# Patient Record
Sex: Female | Born: 1975
Health system: Southern US, Community
[De-identification: ages and names within clinical notes are randomized; demographics above are authoritative.]

## PROBLEM LIST (undated history)

## (undated) HISTORY — PX: OTHER SURGICAL HISTORY: SHX169

## (undated) HISTORY — PX: WISDOM TOOTH EXTRACTION: SHX21

---

## 2004-10-27 HISTORY — PX: TUBAL LIGATION: SHX77

## 2015-02-09 ENCOUNTER — Telehealth: Payer: Self-pay | Admitting: *Deleted

## 2015-02-09 ENCOUNTER — Encounter: Payer: Self-pay | Admitting: *Deleted

## 2015-02-09 NOTE — Telephone Encounter (Signed)
Pre-Visit Call completed with patient and chart updated.   Pre-Visit Info documented in Specialty Comments under [employer].    

## 2015-02-12 ENCOUNTER — Ambulatory Visit (INDEPENDENT_AMBULATORY_CARE_PROVIDER_SITE_OTHER): Payer: 59 | Admitting: Family

## 2015-02-12 ENCOUNTER — Encounter: Payer: Self-pay | Admitting: Family

## 2015-02-12 VITALS — BP 110/70 | HR 95 | Temp 98.1°F | Resp 18 | Ht 60.0 in | Wt 133.2 lb

## 2015-02-12 DIAGNOSIS — L309 Dermatitis, unspecified: Secondary | ICD-10-CM

## 2015-02-12 DIAGNOSIS — Z23 Encounter for immunization: Secondary | ICD-10-CM

## 2015-02-12 DIAGNOSIS — Z0000 Encounter for general adult medical examination without abnormal findings: Secondary | ICD-10-CM | POA: Diagnosis not present

## 2015-02-12 LAB — URINALYSIS, ROUTINE W REFLEX MICROSCOPIC
BILIRUBIN URINE: NEGATIVE
KETONES UR: NEGATIVE
Leukocytes, UA: NEGATIVE
Nitrite: NEGATIVE
Specific Gravity, Urine: <=1.005 — AB (ref 1.000–1.030)
TOTAL PROTEIN, URINE-UPE24: NEGATIVE
URINE GLUCOSE: NEGATIVE
Urobilinogen, UA: 0.2 (ref 0.0–1.0)
WBC, UA: NONE SEEN (ref 0–?)
pH: 6 (ref 5.0–8.0)

## 2015-02-12 LAB — BASIC METABOLIC PANEL
BUN: 11 mg/dL (ref 6–23)
CHLORIDE: 103 meq/L (ref 96–112)
CO2: 26 mEq/L (ref 19–32)
Calcium: 9.9 mg/dL (ref 8.4–10.5)
Creatinine, Ser: 0.61 mg/dL (ref 0.40–1.20)
GFR: 116.58 mL/min (ref >60.00)
Glucose, Bld: 90 mg/dL (ref 70–99)
Potassium: 3.7 mEq/L (ref 3.5–5.1)
Sodium: 137 mEq/L (ref 135–145)

## 2015-02-12 LAB — LIPID PANEL
Cholesterol: 178 mg/dL (ref 0–200)
HDL: 47.2 mg/dL (ref >39.00)
LDL CALC: 96 mg/dL (ref 0–99)
NonHDL: 130.8
Total CHOL/HDL Ratio: 4
Triglycerides: 175 mg/dL — ABNORMAL HIGH (ref 0.0–149.0)
VLDL: 35 mg/dL (ref 0.0–40.0)

## 2015-02-12 LAB — CBC WITH DIFFERENTIAL/PLATELET
Basophils Absolute: 0 10*3/uL (ref 0.0–0.1)
Basophils Relative: 0.5 % (ref 0.0–3.0)
EOS ABS: 0.2 10*3/uL (ref 0.0–0.7)
EOS PCT: 2.2 % (ref 0.0–5.0)
HCT: 30.3 % — ABNORMAL LOW (ref 36.0–46.0)
Hemoglobin: 9.7 g/dL — ABNORMAL LOW (ref 12.0–15.0)
LYMPHS PCT: 26.1 % (ref 12.0–46.0)
Lymphs Abs: 2.2 10*3/uL (ref 0.7–4.0)
MCHC: 32 g/dL (ref 30.0–36.0)
MCV: 67.1 fl — ABNORMAL LOW (ref 78.0–100.0)
Monocytes Absolute: 0.6 10*3/uL (ref 0.1–1.0)
Monocytes Relative: 6.4 % (ref 3.0–12.0)
NEUTROS ABS: 5.6 10*3/uL (ref 1.4–7.7)
Neutrophils Relative %: 64.8 % (ref 43.0–77.0)
Platelets: 373 10*3/uL (ref 150.0–400.0)
RBC: 4.51 Mil/uL (ref 3.87–5.11)
RDW: 18.2 % — ABNORMAL HIGH (ref 11.5–15.5)
WBC: 8.6 10*3/uL (ref 4.0–10.5)

## 2015-02-12 LAB — HEPATIC FUNCTION PANEL
ALBUMIN: 4.2 g/dL (ref 3.5–5.2)
ALK PHOS: 90 U/L (ref 39–117)
ALT: 16 U/L (ref 0–35)
AST: 18 U/L (ref 0–37)
Bilirubin, Direct: 0.1 mg/dL (ref 0.0–0.3)
TOTAL PROTEIN: 7.9 g/dL (ref 6.0–8.3)
Total Bilirubin: 0.4 mg/dL (ref 0.2–1.2)

## 2015-02-12 LAB — TSH: TSH: 4.71 u[IU]/mL — AB (ref 0.35–4.50)

## 2015-02-12 MED ORDER — CLOTRIMAZOLE-BETAMETHASONE 1-0.05 % EX CREA: 1.0000 "application " | TOPICAL_CREAM | Freq: Two times a day (BID) | CUTANEOUS | Status: DC

## 2015-02-12 NOTE — Progress Notes (Signed)
Subjective:    Patient ID: Brittany Banks, female    DOB: [birth year], 39 y.o.   MRN: [id number]  HPI   Brittany Banks is a 39 yr old female who presents today to establish care.   Immunizations: >10 years Diet: reports vegetarian diet Exercise: no  Pap Smear:  ? 9 years ago.   Reports several month hx of dorsal foot rash, + pruritis.    Review of Systems  Constitutional: Negative for unexpected weight change.  HENT: Negative for hearing loss and rhinorrhea.   Eyes: Negative for visual disturbance.  Respiratory: Negative for cough.   Cardiovascular: Negative for leg swelling.  Gastrointestinal: Negative for nausea, diarrhea and constipation.  Genitourinary: Negative for dysuria and frequency.       Notes some irregular menses  Musculoskeletal: Negative for myalgias and arthralgias.       Occasional low back pain  Skin: Positive for rash.       Bilateral dorsal foot.   Neurological:       Occasional headaches around menses  Hematological: Negative for adenopathy.  Psychiatric/Behavioral: Negative for dysphoric mood and agitation.   History reviewed. No pertinent past medical history.  History   Social History  . Marital Status: Married    Spouse Name: N/A  . Number of Children: N/A  . Years of Education: N/A   Occupational History  . Not on file.   Social History Main Topics  . Smoking status: Never Smoker   . Smokeless tobacco: Never Used  . Alcohol Use: No  . Drug Use: No  . Sexual Activity: Not on file   Other Topics Concern  . Not on file   Social History Narrative   Works at [employer] in [place]- moved here 2010   Completed bachelors in [place]   2 children   Married   Enjoys spending time with her children.      Past Surgical History  Procedure Laterality Date  . Wisdom tooth extraction    . Tubal ligation  2006    Family History  Problem Relation Age of Onset  . Kidney disease Son 2    kidney cancer 14 yrs ago  . Hypertension  Mother     No Known Allergies  No current outpatient prescriptions on file prior to visit.   No current facility-administered medications on file prior to visit.    BP 110/70 mmHg  Pulse 95  Temp(Src) 98.1 F (36.7 C) (Oral)  Resp 18  Ht 5' (1.524 m)  Wt 133 lb 3.2 oz (60.419 kg)  BMI 26.01 kg/m2  SpO2 99%       Objective:   Physical Exam  Physical Exam  Constitutional: She is oriented to person, place, and time. She appears well-developed and well-nourished. No distress.  HENT:  Head: small <pea sized, non-tender, non-mobile bony nodule noted left temporal skull.  Right Ear: Tympanic membrane and ear canal normal.  Left Ear: Tympanic membrane and ear canal normal.  Mouth/Throat: Oropharynx is clear and moist.  Eyes: Pupils are equal, round, and reactive to light. No scleral icterus.  Neck: Normal range of motion. No thyromegaly present.  Cardiovascular: Normal rate and regular rhythm.   No murmur heard. Pulmonary/Chest: Effort normal and breath sounds normal. No respiratory distress. He has no wheezes. She has no rales. She exhibits no tenderness.  Abdominal: Soft. Bowel sounds are normal. He exhibits no distension and no mass. There is no tenderness. There is no rebound and no guarding.  Musculoskeletal: She exhibits no edema.  Lymphadenopathy:    She has no cervical adenopathy.  Neurological: She is alert and oriented to person, place, and time. She has normal reflexes. She exhibits normal muscle tone. Coordination normal.  Skin: Skin is warm and dry. hyperpigmented thickened skin, bilateral dorsal feet R>L Psychiatric: She has a normal mood and affect. Her behavior is normal. Judgment and thought content normal.  Breasts: Examined lying Right: Without masses, retractions, discharge or axillary adenopathy.  Left: Without masses, retractions, discharge or axillary adenopathy.  Inguinal/mons: Normal without inguinal adenopathy  External genitalia: Normal    BUS/Urethra/Skene's glands: Normal  Bladder: Normal  Vagina: Normal  Cervix: difficulty to visualize due to pt discomfort Uterus: normal in size, shape and contour. Midline and mobile  Adnexa/parametria:  Rt: Without masses or tenderness.  Lt: Without masses or tenderness.  Anus and perineum: Normal           Assessment & Plan:        Assessment & Plan:  Advised pt to let me know if bony prominence left temporal area enlarges.

## 2015-02-12 NOTE — Progress Notes (Signed)
Pre visit review using our clinic review tool, if applicable. No additional management support is needed unless otherwise documented below in the visit note. 

## 2015-02-12 NOTE — Addendum Note (Signed)
Addended by: Kelle Darting A on: 02/12/2015 12:06 PM   Modules accepted: Orders

## 2015-02-12 NOTE — Assessment & Plan Note (Addendum)
Immunizations reviewed and up to date.  Continue healthy diet, exercise.  Obtain routine labs. Attempted pap. She was unable to tolerated speculum exam. Will refer to GYN.

## 2015-02-12 NOTE — Patient Instructions (Addendum)
Please complete lab work prior to leaving. Work on adding exercise- goal is 30 minutes 5 days a week. Follow up in 1 year, sooner if problems/concerns.  Welcome to [facility]!

## 2015-02-12 NOTE — Assessment & Plan Note (Signed)
Trial of lotrisone

## 2015-02-13 ENCOUNTER — Other Ambulatory Visit (INDEPENDENT_AMBULATORY_CARE_PROVIDER_SITE_OTHER): Payer: 59

## 2015-02-13 DIAGNOSIS — D649 Anemia, unspecified: Secondary | ICD-10-CM | POA: Diagnosis not present

## 2015-02-13 DIAGNOSIS — E039 Hypothyroidism, unspecified: Secondary | ICD-10-CM | POA: Diagnosis not present

## 2015-02-13 DIAGNOSIS — I519 Heart disease, unspecified: Secondary | ICD-10-CM

## 2015-02-13 LAB — T3, FREE: T3 FREE: 3.7 pg/mL (ref 2.3–4.2)

## 2015-02-13 LAB — IRON: Iron: 14 ug/dL — ABNORMAL LOW (ref 42–145)

## 2015-02-13 LAB — T4, FREE: Free T4: 0.98 ng/dL (ref 0.60–1.60)

## 2015-02-14 ENCOUNTER — Telehealth: Payer: Self-pay | Admitting: Family

## 2015-02-14 DIAGNOSIS — E038 Other specified hypothyroidism: Secondary | ICD-10-CM | POA: Insufficient documentation

## 2015-02-14 DIAGNOSIS — E039 Hypothyroidism, unspecified: Secondary | ICD-10-CM

## 2015-02-14 DIAGNOSIS — D509 Iron deficiency anemia, unspecified: Secondary | ICD-10-CM

## 2015-02-14 NOTE — Telephone Encounter (Signed)
Please let pt know she is very anemic.  I would like her to complete IFOB (dx anemia), start iron 325mg  bid, repeat serum iron and cbc in 3 months.  Also, thyroid borderline low. I would like her to repeat tsh, free T3 free t4 in 3 months as well, dx subclinical hypothyroid.

## 2015-02-14 NOTE — Telephone Encounter (Signed)
Notified pt and she voices understanding. Lab appt scheduled for 05/15/15 at 1:30pm. Future lab orders entered.

## 2015-05-15 ENCOUNTER — Other Ambulatory Visit (INDEPENDENT_AMBULATORY_CARE_PROVIDER_SITE_OTHER): Payer: 59

## 2015-05-15 DIAGNOSIS — E039 Hypothyroidism, unspecified: Secondary | ICD-10-CM

## 2015-05-15 DIAGNOSIS — E038 Other specified hypothyroidism: Secondary | ICD-10-CM | POA: Diagnosis not present

## 2015-05-15 DIAGNOSIS — D509 Iron deficiency anemia, unspecified: Secondary | ICD-10-CM | POA: Diagnosis not present

## 2015-05-15 LAB — CBC WITH DIFFERENTIAL/PLATELET
BASOS PCT: 0.5 % (ref 0.0–3.0)
Basophils Absolute: 0.1 10*3/uL (ref 0.0–0.1)
Eosinophils Absolute: 0.3 10*3/uL (ref 0.0–0.7)
Eosinophils Relative: 3 % (ref 0.0–5.0)
HEMATOCRIT: 29.6 % — AB (ref 36.0–46.0)
Hemoglobin: 9.3 g/dL — ABNORMAL LOW (ref 12.0–15.0)
Lymphocytes Relative: 30.1 % (ref 12.0–46.0)
Lymphs Abs: 2.9 10*3/uL (ref 0.7–4.0)
MCHC: 31.4 g/dL (ref 30.0–36.0)
Monocytes Absolute: 0.6 10*3/uL (ref 0.1–1.0)
Monocytes Relative: 6.5 % (ref 3.0–12.0)
Neutro Abs: 5.8 10*3/uL (ref 1.4–7.7)
Neutrophils Relative %: 59.9 % (ref 43.0–77.0)
Platelets: 314 10*3/uL (ref 150.0–400.0)
RBC: 4.45 Mil/uL (ref 3.87–5.11)
RDW: 18.9 % — ABNORMAL HIGH (ref 11.5–15.5)
WBC: 9.6 10*3/uL (ref 4.0–10.5)

## 2015-05-15 LAB — TSH: TSH: 4.5 u[IU]/mL (ref 0.35–4.50)

## 2015-05-15 LAB — IRON: Iron: 17 ug/dL — ABNORMAL LOW (ref 42–145)

## 2015-05-15 LAB — T3, FREE: T3 FREE: 3.9 pg/mL (ref 2.3–4.2)

## 2015-05-15 LAB — T4, FREE: FREE T4: 0.77 ng/dL (ref 0.60–1.60)

## 2015-05-18 ENCOUNTER — Encounter: Payer: Self-pay | Admitting: Family

## 2015-05-18 ENCOUNTER — Telehealth: Payer: Self-pay | Admitting: *Deleted

## 2015-05-18 ENCOUNTER — Ambulatory Visit (INDEPENDENT_AMBULATORY_CARE_PROVIDER_SITE_OTHER): Payer: 59 | Admitting: Family

## 2015-05-18 VITALS — BP 100/62 | HR 89 | Temp 98.9°F | Resp 16 | Ht 60.0 in | Wt 139.4 lb

## 2015-05-18 DIAGNOSIS — L309 Dermatitis, unspecified: Secondary | ICD-10-CM | POA: Diagnosis not present

## 2015-05-18 DIAGNOSIS — R21 Rash and other nonspecific skin eruption: Secondary | ICD-10-CM

## 2015-05-18 DIAGNOSIS — E038 Other specified hypothyroidism: Secondary | ICD-10-CM | POA: Diagnosis not present

## 2015-05-18 DIAGNOSIS — D649 Anemia, unspecified: Secondary | ICD-10-CM

## 2015-05-18 DIAGNOSIS — E039 Hypothyroidism, unspecified: Secondary | ICD-10-CM

## 2015-05-18 DIAGNOSIS — D509 Iron deficiency anemia, unspecified: Secondary | ICD-10-CM | POA: Diagnosis not present

## 2015-05-18 MED ORDER — BETAMETHASONE DIPROPIONATE 0.05 % EX CREA: TOPICAL_CREAM | Freq: Two times a day (BID) | CUTANEOUS | Status: DC

## 2015-05-18 NOTE — Assessment & Plan Note (Addendum)
Advised pt to start iron supplement.  I gave her number for GYN to call to address heavy periods and for her PAP smear.

## 2015-05-18 NOTE — Assessment & Plan Note (Signed)
I don't think rash is fungal. Will change lotrisone to betamethasone and refer to dermatology.

## 2015-05-18 NOTE — Patient Instructions (Signed)
You will be contacted about your referral to dermatology.  Start iron supplement, complete stool kit and return by mail.  Follow up in 3 months.

## 2015-05-18 NOTE — Progress Notes (Signed)
Pre visit review using our clinic review tool, if applicable. No additional management support is needed unless otherwise documented below in the visit note. 

## 2015-05-18 NOTE — Progress Notes (Signed)
   Subjective:    Patient ID: Brittany Banks, female    DOB: 1975/12/03, 39 y.o.   MRN: [id number]  HPI  Brittany Banks is a 39 yr old female who presents today with c/o rash on right foot and knee. Reports that lotrisone has helped but she has now developed patches on her left elbow and scalp.   Anemia-  Reports heavy bleeding x 3 days with clots (changes pad every 1-2 hours).  Reports that she never started iron supplement.  Lab Results  Component Value Date   WBC 9.6 05/15/2015   HGB 9.3* 05/15/2015   HCT 29.6* 05/15/2015   MCV 66.5 Repeated and verified X2.* 05/15/2015   PLT 314.0 05/15/2015      Review of Systems      see HPI  No past medical history on file.  History   Social History  . Marital Status: Married    Spouse Name: N/A  . Number of Children: N/A  . Years of Education: N/A   Occupational History  . Not on file.   Social History Main Topics  . Smoking status: Never Smoker   . Smokeless tobacco: Never Used  . Alcohol Use: No  . Drug Use: No  . Sexual Activity: Not on file   Other Topics Concern  . Not on file   Social History Narrative   Works at [employer] in [place]- moved here 2010   Completed bachelors in [place]   2 children   Married   Enjoys spending time with her children.      Past Surgical History  Procedure Laterality Date  . Wisdom tooth extraction    . Tubal ligation  2006    Family History  Problem Relation Age of Onset  . Kidney disease Son 2    kidney cancer 14 yrs ago  . Hypertension Mother     No Known Allergies  Current Outpatient Prescriptions on File Prior to Visit  Medication Sig Dispense Refill  . clotrimazole-betamethasone (LOTRISONE) cream Apply 1 application topically 2 (two) times daily. 30 g 0  . ferrous sulfate 325 (65 FE) MG tablet Take 325 mg by mouth 2 (two) times daily with a meal.     No current facility-administered medications on file prior to visit.    BP 100/62 mmHg  Pulse 89   Temp(Src) 98.9 F (37.2 C) (Oral)  Resp 16  Ht 5' (1.524 m)  Wt 139 lb 6.4 oz (63.231 kg)  BMI 27.22 kg/m2  SpO2 100%  LMP 03/18/2015    Objective:   Physical Exam  Constitutional: She is oriented to person, place, and time. She appears well-developed and well-nourished. No distress.  HENT:  Head: Normocephalic.  Cardiovascular: Normal rate.   No murmur heard. Pulmonary/Chest: Effort normal and breath sounds normal. No respiratory distress. She has no wheezes. She has no rales. She exhibits no tenderness.  Neurological: She is alert and oriented to person, place, and time.  Psychiatric: She has a normal mood and affect. Her behavior is normal. Judgment and thought content normal.  skin: Scaling rash right dorsal foot- somewhat improved.  Small amount of scaling right knee and bilateral elbows.         Assessment & Plan:

## 2015-05-18 NOTE — Telephone Encounter (Signed)
-----   Message from Debbrah Alar, NP sent at 05/17/2015 11:21 AM EDT ----- Thyroid test looks good.  Iron and blood count is still low.  I do not see that she completed IFOB.  I would still like her to complete. Is she taking iron bid?  If not, needs to start.  If she is taking bid then I need to send her to hematology.

## 2015-05-18 NOTE — Assessment & Plan Note (Signed)
Lab Results  Component Value Date   TSH 4.50 05/15/2015   Monitor TFT's plan follow up in 3 months.

## 2015-05-18 NOTE — Telephone Encounter (Signed)
Notified pt. She has not been taking iron but states she will start taking it. She has f/u in our office today at 1:15pm.  Will give IFOB to pt at that time.  Future order entered.

## 2015-05-25 ENCOUNTER — Other Ambulatory Visit (INDEPENDENT_AMBULATORY_CARE_PROVIDER_SITE_OTHER): Payer: 59

## 2015-05-25 DIAGNOSIS — D649 Anemia, unspecified: Secondary | ICD-10-CM

## 2015-05-25 LAB — FECAL OCCULT BLOOD, IMMUNOCHEMICAL: Fecal Occult Bld: NEGATIVE

## 2015-05-26 ENCOUNTER — Encounter: Payer: Self-pay | Admitting: Family

## 2015-05-31 ENCOUNTER — Telehealth: Payer: Self-pay | Admitting: Family

## 2015-05-31 DIAGNOSIS — D509 Iron deficiency anemia, unspecified: Secondary | ICD-10-CM

## 2015-05-31 NOTE — Telephone Encounter (Signed)
Pt calling for lab results from 05/25/15. Best # [phone].

## 2015-06-01 NOTE — Telephone Encounter (Signed)
Notified pt. She states she has been taking iron twice a day for the last 2 weeks. Has had heavy menstrual cycle and states she feels very weak. Pt wants to know what else she should do? Should she proceed with hematology referral discussed after last labs?  Please advise.

## 2015-06-01 NOTE — Telephone Encounter (Signed)
Notified pt and she voices understanding. States she has not seen GYN yet and is agreeable to referral. She will return to the lab on Monday. Future lab order entered.

## 2015-06-01 NOTE — Telephone Encounter (Signed)
Lets have her return to the lab for follow up cbc (dx anemia).  I think she needs to see GYN- did she schedule appointment?  If anemia is severe or if not improved after 3 months on oral iron then we will proceed with referral to Heme.

## 2015-06-01 NOTE — Telephone Encounter (Signed)
See 05/26/15 letter; no blood in stool.

## 2015-06-04 ENCOUNTER — Other Ambulatory Visit: Payer: 59

## 2015-06-04 ENCOUNTER — Telehealth: Payer: Self-pay | Admitting: Family

## 2015-06-04 DIAGNOSIS — D509 Iron deficiency anemia, unspecified: Secondary | ICD-10-CM

## 2015-06-04 LAB — CBC WITH DIFFERENTIAL/PLATELET
Basophils Absolute: 0 10*3/uL (ref 0.0–0.1)
Basophils Relative: 0.6 % (ref 0.0–3.0)
Eosinophils Absolute: 0.3 10*3/uL (ref 0.0–0.7)
Eosinophils Relative: 3.3 % (ref 0.0–5.0)
HCT: 30.9 % — ABNORMAL LOW (ref 36.0–46.0)
Hemoglobin: 9.8 g/dL — ABNORMAL LOW (ref 12.0–15.0)
LYMPHS PCT: 31.6 % (ref 12.0–46.0)
Lymphs Abs: 2.8 10*3/uL (ref 0.7–4.0)
MCHC: 31.7 g/dL (ref 30.0–36.0)
MCV: 70 fl — AB (ref 78.0–100.0)
MONO ABS: 0.6 10*3/uL (ref 0.1–1.0)
Monocytes Relative: 7 % (ref 3.0–12.0)
NEUTROS ABS: 5.1 10*3/uL (ref 1.4–7.7)
Neutrophils Relative %: 57.5 % (ref 43.0–77.0)
PLATELETS: 388 10*3/uL (ref 150.0–400.0)
RBC: 4.41 Mil/uL (ref 3.87–5.11)
RDW: 23.4 % — ABNORMAL HIGH (ref 11.5–15.5)
WBC: 8.8 10*3/uL (ref 4.0–10.5)

## 2015-06-04 NOTE — Addendum Note (Signed)
Addended by: Debbrah Alar on: 06/04/2015 11:47 AM   Modules accepted: Orders

## 2015-06-04 NOTE — Telephone Encounter (Signed)
Blood count slightly improved. Please follow through with GYN referral (made referral) and continue iron supplement. Repeat cbc in 3 months + serum iron dx iron def anemia.

## 2015-06-04 NOTE — Addendum Note (Signed)
Addended by: Modena Morrow D on: 06/04/2015 10:24 AM   Modules accepted: Orders

## 2015-06-04 NOTE — Telephone Encounter (Signed)
Pt would like [place] to refer her to someone, Please advise your preference?

## 2015-06-06 NOTE — Telephone Encounter (Signed)
Left detailed message on pt's voicemail and to call and schedule lab appt in 3 months. Future lab orders entered.

## 2018-01-06 ENCOUNTER — Encounter: Payer: Self-pay | Admitting: Family

## 2018-01-06 ENCOUNTER — Other Ambulatory Visit: Payer: BLUE CROSS/BLUE SHIELD

## 2018-01-06 ENCOUNTER — Ambulatory Visit (INDEPENDENT_AMBULATORY_CARE_PROVIDER_SITE_OTHER): Payer: BLUE CROSS/BLUE SHIELD | Admitting: Family

## 2018-01-06 ENCOUNTER — Telehealth: Payer: Self-pay | Admitting: *Deleted

## 2018-01-06 VITALS — BP 112/66 | HR 95 | Temp 98.5°F | Resp 16 | Ht 60.5 in | Wt 120.0 lb

## 2018-01-06 DIAGNOSIS — E611 Iron deficiency: Secondary | ICD-10-CM

## 2018-01-06 DIAGNOSIS — D729 Disorder of white blood cells, unspecified: Secondary | ICD-10-CM

## 2018-01-06 DIAGNOSIS — Z0000 Encounter for general adult medical examination without abnormal findings: Secondary | ICD-10-CM | POA: Diagnosis not present

## 2018-01-06 DIAGNOSIS — J309 Allergic rhinitis, unspecified: Secondary | ICD-10-CM | POA: Diagnosis not present

## 2018-01-06 DIAGNOSIS — N920 Excessive and frequent menstruation with regular cycle: Secondary | ICD-10-CM

## 2018-01-06 DIAGNOSIS — K5900 Constipation, unspecified: Secondary | ICD-10-CM | POA: Diagnosis not present

## 2018-01-06 DIAGNOSIS — Z23 Encounter for immunization: Secondary | ICD-10-CM

## 2018-01-06 DIAGNOSIS — D509 Iron deficiency anemia, unspecified: Secondary | ICD-10-CM

## 2018-01-06 LAB — LIPID PANEL
CHOLESTEROL: 158 mg/dL (ref 0–200)
HDL: 50.4 mg/dL (ref >39.00)
LDL Cholesterol: 94 mg/dL (ref 0–99)
NonHDL: 107.99
TRIGLYCERIDES: 69 mg/dL (ref 0.0–149.0)
Total CHOL/HDL Ratio: 3
VLDL: 13.8 mg/dL (ref 0.0–40.0)

## 2018-01-06 LAB — CBC WITH DIFFERENTIAL/PLATELET
BASOS PCT: 2.3 % (ref 0.0–3.0)
Basophils Absolute: 0.1 10*3/uL (ref 0.0–0.1)
Eosinophils Absolute: 0.8 10*3/uL — ABNORMAL HIGH (ref 0.0–0.7)
Eosinophils Relative: 13 % — ABNORMAL HIGH (ref 0.0–5.0)
HCT: 25.9 % — ABNORMAL LOW (ref 36.0–46.0)
Hemoglobin: 7.9 g/dL — CL (ref 12.0–15.0)
LYMPHS ABS: 1.3 10*3/uL (ref 0.7–4.0)
LYMPHS PCT: 20 % (ref 12.0–46.0)
MCHC: 30.6 g/dL (ref 30.0–36.0)
MCV: 59.6 fl — AB (ref 78.0–100.0)
MONOS PCT: 7.6 % (ref 3.0–12.0)
Monocytes Absolute: 0.5 10*3/uL (ref 0.1–1.0)
NEUTROS ABS: 3.7 10*3/uL (ref 1.4–7.7)
Neutrophils Relative %: 57.1 % (ref 43.0–77.0)
PLATELETS: 354 10*3/uL (ref 150.0–400.0)
RBC: 4.35 Mil/uL (ref 3.87–5.11)
RDW: 19.1 % — AB (ref 11.5–15.5)
WBC: 6.4 10*3/uL (ref 4.0–10.5)

## 2018-01-06 LAB — HEPATIC FUNCTION PANEL
ALBUMIN: 3.9 g/dL (ref 3.5–5.2)
ALT: 11 U/L (ref 0–35)
AST: 14 U/L (ref 0–37)
Alkaline Phosphatase: 78 U/L (ref 39–117)
Bilirubin, Direct: 0.1 mg/dL (ref 0.0–0.3)
Total Bilirubin: 0.4 mg/dL (ref 0.2–1.2)
Total Protein: 7.5 g/dL (ref 6.0–8.3)

## 2018-01-06 LAB — URINALYSIS, ROUTINE W REFLEX MICROSCOPIC
Bilirubin Urine: NEGATIVE
KETONES UR: NEGATIVE
LEUKOCYTES UA: NEGATIVE
NITRITE: NEGATIVE
PH: 6 (ref 5.0–8.0)
Specific Gravity, Urine: <=1.005 — AB (ref 1.000–1.030)
Total Protein, Urine: NEGATIVE
Urine Glucose: NEGATIVE
Urobilinogen, UA: 0.2 (ref 0.0–1.0)

## 2018-01-06 LAB — BASIC METABOLIC PANEL
BUN: 7 mg/dL (ref 6–23)
CALCIUM: 9.5 mg/dL (ref 8.4–10.5)
CHLORIDE: 107 meq/L (ref 96–112)
CO2: 26 meq/L (ref 19–32)
Creatinine, Ser: 0.58 mg/dL (ref 0.40–1.20)
GFR: 121.74 mL/min (ref >60.00)
Glucose, Bld: 103 mg/dL — ABNORMAL HIGH (ref 70–99)
Potassium: 3.9 mEq/L (ref 3.5–5.1)
Sodium: 141 mEq/L (ref 135–145)

## 2018-01-06 LAB — TSH: TSH: 3.27 u[IU]/mL (ref 0.35–4.50)

## 2018-01-06 NOTE — Patient Instructions (Addendum)
Please schedule office visit for pap smear at the front desk. Complete lab work prior to leaving.  Please schedule routine dental and eye exam. Schedule mammogram on the first floor. Please add claritin 10mg  once daily for your allergy symptoms.

## 2018-01-06 NOTE — Telephone Encounter (Signed)
Sent add-on for Ferritin.  Confirmation received.//AB/CMA

## 2018-01-06 NOTE — Progress Notes (Signed)
Subjective:    Patient ID: Brittany Banks, female    DOB: 07/17/1976, 42 y.o.   MRN: [id number]  HPI  Brittany Banks is a 42 yr old female who presents today for cpx.  Immunizations: tdap 4/16,  Would like flu shot today  Diet: diet is healthy Exercise:  Not exercising. Pap Smear:? 2 years ago- not sure Mammogram: due Vision:due Dental: due  She reports that she has been more busy and thinks that she may not be eating as much. Wt Readings from Last 3 Encounters:  01/06/18 120 lb (54.4 kg)  05/18/15 139 lb 6.4 oz (63.2 kg)  02/12/15 133 lb 3.2 oz (60.4 kg)   + Rhinorrhea, "itching in my throat sometimes."   Review of Systems  Constitutional: Positive for unexpected weight change.  HENT: Positive for rhinorrhea. Negative for hearing loss.        AM rhinorrhea which she attributes to allergies  Eyes: Negative for visual disturbance.  Respiratory: Negative for cough.   Cardiovascular: Negative for leg swelling.  Gastrointestinal:       Reports hard stool, some rectal pain, "little blood."    Musculoskeletal: Negative for myalgias.       Occasional left elbow pain- left handed  Skin: Negative for rash.  Neurological: Negative for headaches.  Hematological: Negative for adenopathy.  Psychiatric/Behavioral:       Denies depression/anxiety   No past medical history on file.   Social History   Socioeconomic History  . Marital status: Married    Spouse name: Not on file  . Number of children: Not on file  . Years of education: Not on file  . Highest education level: Not on file  Social Needs  . Financial resource strain: Not on file  . Food insecurity - worry: Not on file  . Food insecurity - inability: Not on file  . Transportation needs - medical: Not on file  . Transportation needs - non-medical: Not on file  Occupational History  . Not on file  Tobacco Use  . Smoking status: Never Smoker  . Smokeless tobacco: Never Used  Substance and Sexual Activity  . Alcohol use:  No  . Drug use: No  . Sexual activity: Not on file  Other Topics Concern  . Not on file  Social History Narrative   Works at [employer] in [place]- moved here 2010   Completed bachelors in [place]   2 children   Married   Enjoys spending time with her children.      Past Surgical History:  Procedure Laterality Date  . TUBAL LIGATION  2006  . WISDOM TOOTH EXTRACTION      Family History  Problem Relation Age of Onset  . Kidney disease Son 2       kidney cancer 14 yrs ago  . Hypertension Mother     No Known Allergies  Current Outpatient Medications on File Prior to Visit  Medication Sig Dispense Refill  . betamethasone dipropionate (DIPROLENE) 0.05 % cream Apply topically 2 (two) times daily. 30 g 0  . clotrimazole-betamethasone (LOTRISONE) cream Apply 1 application topically 2 (two) times daily. 30 g 0  . ferrous sulfate 325 (65 FE) MG tablet Take 325 mg by mouth 2 (two) times daily with a meal.     No current facility-administered medications on file prior to visit.     BP 112/66 (BP Location: Right Arm, Patient Position: Sitting, Cuff Size: Small)   Pulse 95   Temp  98.5 F (36.9 C) (Oral)   Resp 16   Ht 5' 0.5" (1.537 m)   Wt 120 lb (54.4 kg)   LMP 01/04/2018   SpO2 100%   BMI 23.05 kg/m       Objective:   Physical Exam  Physical Exam  Constitutional: She is oriented to person, place, and time. She appears well-developed and well-nourished. No distress.  HENT:  Head: Normocephalic and atraumatic.  Right Ear: Tympanic membrane and ear canal normal.  Left Ear: Tympanic membrane and ear canal normal.  Mouth/Throat: Oropharynx is clear and moist.  Eyes: Pupils are equal, round, and reactive to light. No scleral icterus.  Neck: Normal range of motion. No thyromegaly present.  Cardiovascular: Normal rate and regular rhythm.   No murmur heard. Pulmonary/Chest: Effort normal and breath sounds normal. No respiratory distress. He has no wheezes. She  has no rales. She exhibits no tenderness.  Abdominal: Soft. Bowel sounds are normal. She exhibits no distension and no mass. There is no tenderness. There is no rebound and no guarding.  Musculoskeletal: She exhibits no edema.  Lymphadenopathy:    She has no cervical adenopathy.  Neurological: She is alert and oriented to person, place, and time. She has normal patellar reflexes. She exhibits normal muscle tone. Coordination normal.  Skin: Skin is warm and dry.  Psychiatric: She has a normal mood and affect. Her behavior is normal. Judgment and thought content normal.  Breasts: Examined lying Right: Without masses, retractions, discharge or axillary adenopathy.  Left: Without masses, retractions, discharge or axillary adenopathy.  Pelvic:           Assessment & Plan:         Assessment & Plan:  Preventative care- discussed healthy diet, regular exercise. She has lost a lot of weight since I last saw her but reports that her weight has been stable x 1 year.  EKG tracing is personally reviewed.  EKG notes NSR.  No acute changes. Flu shot today, refer for mammogram. She will return in 2 weeks for pap.  Constipation- discussed importance of high fiber diet, plenty of water. Has intermittent rectal pain/bleeding. Suspect hemorrhoids. Will plan rectal exam in 2 weeks with her pap.  Allergic rhinitis- advised rx with claritin.

## 2018-01-06 NOTE — Telephone Encounter (Signed)
Received critical hg for her today  Lab Results  Component Value Date   WBC 6.4 01/06/2018   HGB 7.9 Repeated and verified X2. (LL) 01/06/2018   HCT 25.9 (L) 01/06/2018   MCV 59.6 (L) 01/06/2018   PLT 354.0 01/06/2018   Per note she does have intermittent rectal bleeding Her hg has been low in the past but not this low - in the 9s  Called her- she reports heavy menses for a long time, but worse over the last year.  She may bleed heavily for 3 days, then more lightly for 2 days more She is on her menses right now, on the 2nd day  Asked her about rectal bleeding- this sounds like just blood on tissue likely from a fissure, nothing significant  She is not acutely symptomatic from her anemia Referral to GYN asap- she likely needs an ablation Will also refer to hematology - she may need an iron infusion Added on a ferritin level today in lab

## 2018-01-06 NOTE — Telephone Encounter (Signed)
CRITICAL VALUE STICKER  CRITICAL VALUE:Hgb:7.9  RECEIVER (on-site recipient of call):[place] NOTIFIED:01/06/18 @ 2:45pm  MESSENGER (representative from lab):Holly  MD NOTIFIED:Dr. Copland- (DOD)  TIME OF NOTIFICATION:01/06/18 @ 2:49pm  RESPONSE:Dr. Copland wanted the message sent to her to address.

## 2018-01-07 ENCOUNTER — Other Ambulatory Visit (INDEPENDENT_AMBULATORY_CARE_PROVIDER_SITE_OTHER): Payer: BLUE CROSS/BLUE SHIELD

## 2018-01-07 ENCOUNTER — Telehealth: Payer: Self-pay | Admitting: Family

## 2018-01-07 DIAGNOSIS — D509 Iron deficiency anemia, unspecified: Secondary | ICD-10-CM

## 2018-01-07 LAB — PATHOLOGIST SMEAR REVIEW

## 2018-01-07 LAB — FERRITIN: Ferritin: 4.4 ng/mL — ABNORMAL LOW (ref 10.0–291.0)

## 2018-01-07 MED ORDER — FERROUS SULFATE 325 (65 FE) MG PO TABS
325.0000 mg | ORAL_TABLET | Freq: Two times a day (BID) | ORAL | 3 refills | Status: DC
Start: 2018-01-07 — End: 2018-10-18

## 2018-01-07 NOTE — Telephone Encounter (Signed)
Please disregard, this has already been taken care of by another provider.

## 2018-01-07 NOTE — Telephone Encounter (Signed)
Called her to go over her ferritin- quite low.  Will send in an rx for 350 BID. Will touch base with Dr. Marin Olp pending her appt to see if he has any other recs. She is feeling ok- a bit tired, but no SOB or CP.  Her menses are ending now.    Lab Results  Component Value Date   FERRITIN 4.4 (L) 01/07/2018

## 2018-01-07 NOTE — Addendum Note (Signed)
Addended by: Lamar Blinks C on: 01/07/2018 12:46 PM   Modules accepted: Orders

## 2018-01-07 NOTE — Telephone Encounter (Signed)
Please contact pt and let her know that she is very anemic. Has she been taking iron supplement?  If not please restart iron supplement.  Also, I would like her to complete IFOB card to make sure she is not having blood loss through her GI tract.  Is her period really heavy?    Also, please ask lab to add on serum iron and ferritin.  She will need to repeat cbc, serum iron and ferritin in 3 months.

## 2018-01-20 ENCOUNTER — Encounter: Payer: Self-pay | Admitting: Family

## 2018-01-20 ENCOUNTER — Other Ambulatory Visit: Payer: Self-pay | Admitting: Family

## 2018-01-20 ENCOUNTER — Ambulatory Visit: Payer: BLUE CROSS/BLUE SHIELD | Admitting: Family

## 2018-01-20 VITALS — BP 109/69 | HR 72 | Temp 98.4°F | Resp 16 | Ht 60.5 in | Wt 123.8 lb

## 2018-01-20 DIAGNOSIS — D649 Anemia, unspecified: Secondary | ICD-10-CM

## 2018-01-20 DIAGNOSIS — Z0000 Encounter for general adult medical examination without abnormal findings: Secondary | ICD-10-CM

## 2018-01-20 NOTE — Patient Instructions (Signed)
Please complete stool cards and mail back at your earliest convenience. You should be contacted about your referral to GYN.

## 2018-01-20 NOTE — Progress Notes (Signed)
Subjective:     Patient ID: Brittany Banks, female   DOB: 10/12/1976, 42 y.o.   MRN: [id number]  HPI   Reports that she has  Only every had 1 pap smear 2 years ago and this was normal. Reports periods are normal, no problems.  No rectal bleeding or pain since last visit.    Review of Systems See HPI  No past medical history on file.   Social History   Socioeconomic History  . Marital status: Married    Spouse name: Not on file  . Number of children: Not on file  . Years of education: Not on file  . Highest education level: Not on file  Occupational History  . Not on file  Social Needs  . Financial resource strain: Not on file  . Food insecurity:    Worry: Not on file    Inability: Not on file  . Transportation needs:    Medical: Not on file    Non-medical: Not on file  Tobacco Use  . Smoking status: Never Smoker  . Smokeless tobacco: Never Used  Substance and Sexual Activity  . Alcohol use: No  . Drug use: No  . Sexual activity: Not on file  Lifestyle  . Physical activity:    Days per week: Not on file    Minutes per session: Not on file  . Stress: Not on file  Relationships  . Social connections:    Talks on phone: Not on file    Gets together: Not on file    Attends religious service: Not on file    Active member of club or organization: Not on file    Attends meetings of clubs or organizations: Not on file    Relationship status: Not on file  . Intimate partner violence:    Fear of current or ex partner: Not on file    Emotionally abused: Not on file    Physically abused: Not on file    Forced sexual activity: Not on file  Other Topics Concern  . Not on file  Social History Narrative   Works at [employer] in [place]- moved here 2010   Completed bachelors in [place]   2 children (2 sons) 10 and 58 yrs old   Married   Enjoys spending time with her children.      Past Surgical History:  Procedure Laterality Date  . TUBAL LIGATION  2006   . WISDOM TOOTH EXTRACTION      Family History  Problem Relation Age of Onset  . Kidney disease Son 2       kidney cancer 14 yrs ago  . Hypertension Mother   . Heart Problems Father        hole in his heart- died in late 58's    No Known Allergies  Current Outpatient Medications on File Prior to Visit  Medication Sig Dispense Refill  . betamethasone dipropionate (DIPROLENE) 0.05 % cream Apply topically 2 (two) times daily. 30 g 0  . clotrimazole-betamethasone (LOTRISONE) cream Apply 1 application topically 2 (two) times daily. 30 g 0  . ferrous sulfate 325 (65 FE) MG tablet Take 325 mg by mouth 2 (two) times daily with a meal.    . ferrous sulfate 325 (65 FE) MG tablet Take 1 tablet (325 mg total) by mouth 2 (two) times daily with a meal. 60 tablet 3   No current facility-administered medications on file prior to visit.     BP 109/69 (  BP Location: Right Arm, Patient Position: Sitting, Cuff Size: Small)   Pulse 72   Temp 98.4 F (36.9 C) (Oral)   Resp 16   Ht 5' 0.5" (1.537 m)   Wt 123 lb 12.8 oz (56.2 kg)   LMP 01/04/2018   SpO2 100%   BMI 23.78 kg/m        Objective:   Physical Exam  Constitutional: She is oriented to person, place, and time. She appears well-developed and well-nourished.  Cardiovascular: Normal rate, regular rhythm and normal heart sounds.  No murmur heard. Pulmonary/Chest: Effort normal and breath sounds normal. No respiratory distress. She has no wheezes.  Musculoskeletal: She exhibits no edema.  Neurological: She is alert and oriented to person, place, and time.  Skin: Skin is warm and dry.  Psychiatric: She has a normal mood and affect. Her behavior is normal. Judgment and thought content normal.  Normal external rectal exam and normal external vulva     Assessment:     Anemia- will obtain IFOB.  She has already been referred to hematology.  I attempted a pap smear today but pt was unable to tolerate speculum exam, kept sliding up the  table- therefore could not complete. Internal ectal exam was also deferred at this time.      Plan:

## 2018-01-21 ENCOUNTER — Inpatient Hospital Stay: Payer: BLUE CROSS/BLUE SHIELD | Attending: Hematology & Oncology

## 2018-01-21 ENCOUNTER — Inpatient Hospital Stay (HOSPITAL_BASED_OUTPATIENT_CLINIC_OR_DEPARTMENT_OTHER): Payer: BLUE CROSS/BLUE SHIELD | Admitting: Family

## 2018-01-21 VITALS — BP 110/73 | HR 71 | Temp 98.2°F | Resp 16 | Wt 122.0 lb

## 2018-01-21 DIAGNOSIS — D510 Vitamin B12 deficiency anemia due to intrinsic factor deficiency: Secondary | ICD-10-CM | POA: Insufficient documentation

## 2018-01-21 DIAGNOSIS — Z8051 Family history of malignant neoplasm of kidney: Secondary | ICD-10-CM

## 2018-01-21 DIAGNOSIS — K5900 Constipation, unspecified: Secondary | ICD-10-CM

## 2018-01-21 DIAGNOSIS — D649 Anemia, unspecified: Secondary | ICD-10-CM | POA: Diagnosis not present

## 2018-01-21 LAB — CBC WITH DIFFERENTIAL (CANCER CENTER ONLY)
Basophils Absolute: 0.1 10*3/uL (ref 0.0–0.1)
Basophils Relative: 1 %
EOS PCT: 6 %
Eosinophils Absolute: 0.6 10*3/uL — ABNORMAL HIGH (ref 0.0–0.5)
HEMATOCRIT: 30.5 % — AB (ref 34.8–46.6)
Hemoglobin: 9 g/dL — ABNORMAL LOW (ref 11.6–15.9)
LYMPHS ABS: 1.8 10*3/uL (ref 0.9–3.3)
LYMPHS PCT: 20 %
MCH: 19.9 pg — AB (ref 26.0–34.0)
MCHC: 29.5 g/dL — AB (ref 32.0–36.0)
MCV: 67.3 fL — AB (ref 81.0–101.0)
MONO ABS: 0.4 10*3/uL (ref 0.1–0.9)
MONOS PCT: 4 %
NEUTROS ABS: 6.1 10*3/uL (ref 1.5–6.5)
Neutrophils Relative %: 69 %
PLATELETS: 423 10*3/uL — AB (ref 145–400)
RBC: 4.53 MIL/uL (ref 3.70–5.32)
RDW: 25.8 % — ABNORMAL HIGH (ref 11.1–15.7)
WBC Count: 9 10*3/uL (ref 3.9–10.0)

## 2018-01-21 LAB — CMP (CANCER CENTER ONLY)
ALT: 19 U/L (ref 10–47)
ANION GAP: 4 — AB (ref 5–15)
AST: 22 U/L (ref 11–38)
Albumin: 3.5 g/dL (ref 3.5–5.0)
Alkaline Phosphatase: 79 U/L (ref 26–84)
BUN: 7 mg/dL (ref 7–22)
CO2: 29 mmol/L (ref 18–33)
Calcium: 9.1 mg/dL (ref 8.0–10.3)
Chloride: 107 mmol/L (ref 98–108)
Creatinine: 0.6 mg/dL (ref 0.60–1.20)
GLUCOSE: 77 mg/dL (ref 73–118)
POTASSIUM: 3.3 mmol/L (ref 3.3–4.7)
SODIUM: 140 mmol/L (ref 128–145)
Total Bilirubin: 0.6 mg/dL (ref 0.2–1.6)
Total Protein: 7.5 g/dL (ref 6.4–8.1)

## 2018-01-21 LAB — SAVE SMEAR

## 2018-01-21 NOTE — Progress Notes (Signed)
Hematology/Oncology Consultation   Name: Brittany Banks      MRN: [id number]    Location: Room/bed info not found  Date: 01/21/2018 Time:3:43 PM   REFERRING PHYSICIAN: Lamar Blinks, MD  REASON FOR CONSULT: Microcytic anemia    DIAGNOSIS:  Iron deficiency anemia   HISTORY OF PRESENT ILLNESS: Ms. Brittany Banks is a very pleasant 42 yo female originally from [place]. 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  Her cycle is regular lasting 4-5 days and quite heavy. She is symptomatic with fatigue and weakness during and for several days after her cycle.  Her ferritin 2 weeks ago was 4.4 and Hgb 7.9. She states that she was 3 days unto her cycle when these labs were drawn. Hgb today is 9.0, platelet count is 423.  She is taking an oral iron supplement but is having issues with constipation.  She stays chilled and will have palpitations with exertion.  No other bleeding, no bruising or petechiae. No lymphadenopathy noted on exam.  She states that there is no family history of anemia.  She had her wisdom teeth extracted years ago with no complications.  No personal history of cancer. Her son had kidney cancer at age 14 and had the kidney removed. He is now 41 yo and doing quite well.  She is scheduled to have her fist mammogram 01/27/18.  No fever, n/v, cough, rash, dizziness, SOB, chest pain, abdominal pain or changes in bowel or bladder habits.  No swelling, tenderness, numbness or tingling in her extremities. No c/o pain.  No lymphadenopathy noted on exam, no fluid wave  She has maintained a good appetite and is a vegetarian. She is hydrating well at home. Her weight is stable.  She does not smoke or drink alcoholic beverages.   ROS: All other 10 point review of systems is negative.   PAST MEDICAL HISTORY:   No past medical history on file.  ALLERGIES: No Known Allergies    MEDICATIONS:  Current Outpatient Medications on File Prior to Visit  Medication  Sig Dispense Refill  . betamethasone dipropionate (DIPROLENE) 0.05 % cream Apply topically 2 (two) times daily. 30 g 0  . clotrimazole-betamethasone (LOTRISONE) cream Apply 1 application topically 2 (two) times daily. 30 g 0  . ferrous sulfate 325 (65 FE) MG tablet Take 325 mg by mouth 2 (two) times daily with a meal.    . ferrous sulfate 325 (65 FE) MG tablet Take 1 tablet (325 mg total) by mouth 2 (two) times daily with a meal. 60 tablet 3   No current facility-administered medications on file prior to visit.      PAST SURGICAL HISTORY Past Surgical History:  Procedure Laterality Date  . TUBAL LIGATION  2006  . WISDOM TOOTH EXTRACTION      FAMILY HISTORY: Family History  Problem Relation Age of Onset  . Kidney disease Son 2       kidney cancer 14 yrs ago  . Hypertension Mother   . Heart Problems Father        hole in his heart- died in late 71's    SOCIAL HISTORY:  reports that she has never smoked. She has never used smokeless tobacco. She reports that she does not drink alcohol or use drugs.  PERFORMANCE STATUS: The patient's performance status is 1 - Symptomatic but completely ambulatory  PHYSICAL EXAM: Most Recent Vital Signs: Blood pressure 110/73, pulse 71, temperature 98.2 F (36.8 C),  temperature source Oral, resp. rate 16, weight 122 lb (55.3 kg), last menstrual period 01/04/2018, SpO2 100 %. BP 110/73 (Patient Position: Sitting)   Pulse 71   Temp 98.2 F (36.8 C) (Oral)   Resp 16   Wt 122 lb (55.3 kg)   LMP 01/04/2018   SpO2 100%   BMI 23.43 kg/m   General Appearance:    Alert, cooperative, no distress, appears stated age  Head:    Normocephalic, without obvious abnormality, atraumatic  Eyes:    PERRL, conjunctiva/corneas clear, EOM's intact, fundi    benign, both eyes        Throat:   Lips, mucosa, and tongue normal; teeth and gums normal  Neck:   Supple, symmetrical, trachea midline, no adenopathy;    thyroid:  no enlargement/tenderness/nodules; no  carotid   bruit or JVD  Back:     Symmetric, no curvature, ROM normal, no CVA tenderness  Lungs:     Clear to auscultation bilaterally, respirations unlabored  Chest Wall:    No tenderness or deformity   Heart:    Regular rate and rhythm, S1 and S2 normal, no murmur, rub   or gallop     Abdomen:     Soft, non-tender, bowel sounds active all four quadrants,    no masses, no organomegaly        Extremities:   Extremities normal, atraumatic, no cyanosis or edema  Pulses:   2+ and symmetric all extremities  Skin:   Skin color, texture, turgor normal, no rashes or lesions  Lymph nodes:   Cervical, supraclavicular, and axillary nodes normal  Neurologic:   CNII-XII intact, normal strength, sensation and reflexes    throughout    LABORATORY DATA:  Results for orders placed or performed in visit on 01/21/18 (from the past 48 hour(s))  Save smear     Status: None   Collection Time: 01/21/18  2:28 PM  Result Value Ref Range   Smear Review SMEAR STAINED AND AVAILABLE FOR REVIEW     Comment: Performed at [facility] Lab at [facility], [address] [place], [place] [address]  CMP ([facility] only)     Status: Abnormal   Collection Time: 01/21/18  2:28 PM  Result Value Ref Range   Sodium 140 128 - 145 mmol/L   Potassium 3.3 3.3 - 4.7 mmol/L   Chloride 107 98 - 108 mmol/L   CO2 29 18 - 33 mmol/L   Glucose, Bld 77 73 - 118 mg/dL   BUN 7 7 - 22 mg/dL   Creatinine 0.60 0.60 - 1.20 mg/dL   Calcium 9.1 8.0 - 10.3 mg/dL   Total Protein 7.5 6.4 - 8.1 g/dL   Albumin 3.5 3.5 - 5.0 g/dL   AST 22 11 - 38 U/L   ALT 19 10 - 47 U/L   Alkaline Phosphatase 79 26 - 84 U/L   Total Bilirubin 0.6 0.2 - 1.6 mg/dL   Anion gap 4 (L) 5 - 15    Comment: Performed at [facility] Lab at [facility], [address], [place], [place] [address]  CBC with Differential (Cancer Center Only)     Status: Abnormal   Collection Time: 01/21/18  2:28 PM  Result Value  Ref Range   WBC Count 9.0 3.9 - 10.0 K/uL   RBC 4.53 3.70 - 5.32 MIL/uL   Hemoglobin 9.0 (L) 11.6 - 15.9 g/dL   HCT 30.5 (L) 34.8 - 46.6 %  MCV 67.3 (L) 81.0 - 101.0 fL   MCH 19.9 (L) 26.0 - 34.0 pg   MCHC 29.5 (L) 32.0 - 36.0 g/dL   RDW 25.8 (H) 11.1 - 15.7 %   Platelet Count 423 (H) 145 - 400 K/uL   Neutrophils Relative % 69 %   Neutro Abs 6.1 1.5 - 6.5 K/uL   Lymphocytes Relative 20 %   Lymphs Abs 1.8 0.9 - 3.3 K/uL   Monocytes Relative 4 %   Monocytes Absolute 0.4 0.1 - 0.9 K/uL   Eosinophils Relative 6 %   Eosinophils Absolute 0.6 (H) 0.0 - 0.5 K/uL   Basophils Relative 1 %   Basophils Absolute 0.1 0.0 - 0.1 K/uL    Comment: Performed at [facility] Lab at [facility], [address] [place], [place] [address]      RADIOGRAPHY: No results found.     PATHOLOGY: None  ASSESSMENT/PLAN: Ms. Encinas is a very pleasant 42 yo female with iron deficiency anemia secondary to heavy cycles. She is symptomatic with fatigue and weakness as well as chills and palpitations, with exertion.  We will see what her iron studies and other lab work show. She has tried oral iron and will add vitamin C to her diet. The iron supplement has caused some constipation.  We will plan to see her back in another 6 weeks and check a hemoglobinopathy eval at that time.   All questions were answered and she is in agreement with the plan. She will contact our office with any questions or concerns. We can certainly see her much sooner if necessary.  She was discussed with and also seen by Dr. Marin Olp and he is in agreement with the aforementioned.   Laverna Peace     Addendum: I saw and examined the patient with Judson Roch.  I agree with the above assessment.  I looked at her blood smear.  She clearly has microcytic red blood cells.  I really did not see any target cells.  As such, I would suspect that she has iron deficiency.  Given that she is of the [place] ethnicity, I thought  maybe there could be some thalassemia.  We will check her hemoglobinopathy to see if that is the case.  Currently, she is on oral iron.  We will see how this helps.  It looks like it might be helping.  Her hemoglobin has already improved after 2 weeks.  We told her to continue the oral iron and to add vitamin C supplements.  This hopefully will help improve iron absorption.  She is very nice.  We spent about 40 minutes with her.  We answered all of her questions.  Over half the time was spent face-to-face.  We will plan to see her back in about 6 weeks.  At this time, we will see if she needs to be placed onto oral iron.  Lattie Haw, MD

## 2018-01-22 DIAGNOSIS — D649 Anemia, unspecified: Secondary | ICD-10-CM | POA: Diagnosis not present

## 2018-01-22 LAB — RETICULOCYTES
RBC.: 4.66 MIL/uL (ref 3.70–5.45)
RETIC CT PCT: 1.9 % (ref 0.7–2.1)
Retic Count, Absolute: 88.5 10*3/uL (ref 33.7–90.7)

## 2018-01-22 LAB — IRON AND TIBC
Iron: 49 ug/dL (ref 41–142)
Saturation Ratios: 11 % — ABNORMAL LOW (ref 21–57)
TIBC: 431 ug/dL (ref 236–444)
UIBC: 382 ug/dL

## 2018-01-22 LAB — VITAMIN B12: VITAMIN B 12: 266 pg/mL (ref 180–914)

## 2018-01-22 LAB — LACTATE DEHYDROGENASE: LDH: 200 U/L (ref 125–245)

## 2018-01-22 LAB — ERYTHROPOIETIN: Erythropoietin: 23.6 m[IU]/mL — ABNORMAL HIGH (ref 2.6–18.5)

## 2018-01-25 ENCOUNTER — Telehealth: Payer: Self-pay

## 2018-01-25 NOTE — Telephone Encounter (Signed)
Copied from [place]. Topic: Inquiry >> Jan 25, 2018  9:54 AM Arletha Grippe wrote: Reason for CRM: please call pt back when labs are ready  Cb is [phone]

## 2018-01-27 ENCOUNTER — Ambulatory Visit (HOSPITAL_BASED_OUTPATIENT_CLINIC_OR_DEPARTMENT_OTHER)
Admission: RE | Admit: 2018-01-27 | Discharge: 2018-01-27 | Disposition: A | Discharge status: Home / Self Care | Payer: BLUE CROSS/BLUE SHIELD | Source: Ambulatory Visit | Attending: Family | Admitting: Family

## 2018-01-27 ENCOUNTER — Encounter (INDEPENDENT_AMBULATORY_CARE_PROVIDER_SITE_OTHER): Payer: Self-pay

## 2018-01-27 DIAGNOSIS — Z0000 Encounter for general adult medical examination without abnormal findings: Secondary | ICD-10-CM | POA: Insufficient documentation

## 2018-01-27 DIAGNOSIS — Z1231 Encounter for screening mammogram for malignant neoplasm of breast: Secondary | ICD-10-CM | POA: Insufficient documentation

## 2018-01-28 LAB — HM PAP SMEAR: HM Pap smear: NEGATIVE

## 2018-02-12 ENCOUNTER — Encounter (HOSPITAL_COMMUNITY): Payer: Self-pay | Admitting: Emergency Medicine

## 2018-02-12 ENCOUNTER — Ambulatory Visit: Payer: Self-pay | Admitting: *Deleted

## 2018-02-12 ENCOUNTER — Ambulatory Visit (HOSPITAL_COMMUNITY)
Admission: EM | Admit: 2018-02-12 | Discharge: 2018-02-12 | Disposition: A | Discharge status: Home / Self Care | Payer: BLUE CROSS/BLUE SHIELD | Attending: Internal Medicine | Admitting: Internal Medicine

## 2018-02-12 DIAGNOSIS — L237 Allergic contact dermatitis due to plants, except food: Secondary | ICD-10-CM | POA: Diagnosis not present

## 2018-02-12 MED ORDER — PREDNISONE 20 MG PO TABS: 20.0000 mg | ORAL_TABLET | Freq: Every day | ORAL | 0 refills | Status: DC

## 2018-02-12 NOTE — ED Provider Notes (Signed)
[facility]    CSN: [id number] Arrival date & time: 02/12/18  1357     History   Chief Complaint Chief Complaint  Patient presents with  . Rash    HPI Brittany Banks is a 42 y.o. female.   She presents today with an itchy rash on her arms and face that started 4 days ago.  She went camping with her son over the weekend.  He had a rash briefly, but his went away.  Her rash seems to be involving more body area, and is very itchy.  Feels fine otherwise.    HPI  History reviewed. No pertinent past medical history.  Patient Active Problem List   Diagnosis Date Noted  . Anemia, iron deficiency 02/14/2015  . Subclinical hypothyroidism 02/14/2015  . Dermatitis 02/12/2015  . Preventative health care 02/12/2015    Past Surgical History:  Procedure Laterality Date  . TUBAL LIGATION  2006  . WISDOM TOOTH EXTRACTION      Home Medications    Prior to Admission medications   Medication Sig Start Date End Date Taking? Authorizing Provider  betamethasone dipropionate (DIPROLENE) 0.05 % cream Apply topically 2 (two) times daily. 05/18/15   Debbrah Alar, NP  clotrimazole-betamethasone (LOTRISONE) cream Apply 1 application topically 2 (two) times daily. 02/12/15   Debbrah Alar, NP  ferrous sulfate 325 (65 FE) MG tablet Take 325 mg by mouth 2 (two) times daily with a meal.    [provider]  ferrous sulfate 325 (65 FE) MG tablet Take 1 tablet (325 mg total) by mouth 2 (two) times daily with a meal. 01/07/18   Copland, Gay Filler, MD  predniSONE (DELTASONE) 20 MG tablet Take 1 tablet (20 mg total) by mouth daily. 3 tabs qd x 3d then 2 tabs qd x 3d then 1 tab qd x 3d then 0.5 tab qd x 4d then stop. 02/12/18   Wynona Luna, MD    Family History Family History  Problem Relation Age of Onset  . Kidney disease Son 2       kidney cancer 14 yrs ago  . Hypertension Mother   . Heart Problems Father        hole in his heart- died in late 66's    Social  History Social History   Tobacco Use  . Smoking status: Never Smoker  . Smokeless tobacco: Never Used  Substance Use Topics  . Alcohol use: No  . Drug use: No     Allergies   Patient has no known allergies.   Review of Systems Review of Systems  All other systems reviewed and are negative.    Physical Exam Triage Vital Signs ED Triage Vitals [02/12/18 1438]  Enc Vitals Group     BP 116/72     Pulse Rate 78     Resp 16     Temp 98.2 F (36.8 C)     Temp src      SpO2 100 %     Weight      Height      Pain Score      Pain Loc    Updated Vital Signs BP 116/72   Pulse 78   Temp 98.2 F (36.8 C)   Resp 16   LMP 01/18/2018   SpO2 100%  Physical Exam  Constitutional: She is oriented to person, place, and time. No distress.  HENT:  Head: Atraumatic.  Eyes:  Conjugate gaze observed, no eye redness/discharge  Neck: Neck supple.  Cardiovascular: Normal rate.  Pulmonary/Chest: No respiratory distress.  Abdominal: She exhibits no distension.  Musculoskeletal: Normal range of motion.  Neurological: She is alert and oriented to person, place, and time.  Skin: Skin is warm and dry.  Face and arms covered with numerous erythematous wheals, many with vesicles on top.  Nursing note and vitals reviewed.    UC Treatments / Results  Labs (all labs ordered are listed, but only abnormal results are displayed) Labs Reviewed - No data to display  EKG None Radiology No results found.  Procedures Procedures (including critical care time) None today  Final Clinical Impressions(s) / UC Diagnoses   Final diagnoses:  Poison ivy dermatitis   Anticipate gradual improvement in itching and rash over the next 2-3 weeks.  Prescription for prednisone (steroid) was sent to the pharmacy and should help decrease itching/rash.  Recheck or followup with your primary care provider for further evaluation if symptoms are not improving as expected.    ED Discharge Orders         Ordered    predniSONE (DELTASONE) 20 MG tablet  Daily     02/12/18 1543         Wynona Luna, MD 02/15/18 2103

## 2018-02-12 NOTE — ED Triage Notes (Signed)
Pt c/o bumps all over body since Monday. Pt sleeps in bed with husband and he does not have rash. Pt states she want camping and her son also had a rash, but it went away, but hers has gotten worse and worse.

## 2018-02-12 NOTE — Telephone Encounter (Signed)
Lips   Are  Starting to  [place]  Rash  Is   Getting  Worse. Patient took a  Benadryl   Earlier today .She  Is  Speaking in  Complete  Sentences .  Patient  Denies  Any shortness of breath . No  Availability  At  [employer]  Today. Patient advised  To  Go  To the [facility]  Urgent care. Directions  given      Reason for Disposition . [1] Severe localized itching AND [2] after 2 days of steroid cream  Answer Assessment - Initial Assessment Questions 1. APPEARANCE of RASH: "Describe the rash."       Red    sm dots  Raised     2. LOCATION: "Where is the rash located?"     Face   Hands  And  Feet    3. NUMBER: "How many spots are there?"       Lots  Of  Little  Spots    And  Big  Spots   4. SIZE: "How big are the spots?" (Inches, centimeters or compare to size of a coin)        Some small  Some  Size of  Quarter    5. ONSET: "When did the rash start?"         5  Days     6. ITCHING: "Does the rash itch?" If so, ask: "How bad is the itch?"  (Scale 1-10; or mild, moderate, severe)        Yes  It itches    7. PAIN: "Does the rash hurt?" If so, ask: "How bad is the pain?"  (Scale 1-10; or mild, moderate, severe)     No pain  8. OTHER SYMPTOMS: "Do you have any other symptoms?" (e.g., fever)       No    9. PREGNANCY: "Is there any chance you are pregnant?" "When was your last menstrual period?"       Ended  Yesterday  Protocols used: RASH OR REDNESS - LOCALIZED-A-AH

## 2018-02-12 NOTE — Discharge Instructions (Addendum)
Anticipate gradual improvement in itching and rash over the next 2-3 weeks.  Prescription for prednisone (steroid) was sent to the pharmacy and should help decrease itching/rash.  Recheck or followup with your primary care provider for further evaluation if symptoms are not improving as expected.

## 2018-10-17 NOTE — Progress Notes (Addendum)
[place] at [facility] [address] [place], [place] [address] [phone]  Date:  10/18/2018   Name:  Brittany Banks   DOB:  [birth year]   MRN:  [id number]  PCP:  Debbrah Alar, NP    Chief Complaint: Dizziness ( 2 weeks, no energy, dizzy, check iron-go over results from cancer senter upstairs); Fatigue; and Leg Pain (stiffness at end of the day, bilateral leg)   History of Present Illness:  Brittany Banks is a 42 y.o. 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  She will bleed heavily for 1-2 days only, then will bleed more lightly for about 4 additional days Her LMP was about 3 weeks ago S/p BTL so we do not suspect pregnancy She has 2 sons, ages 28 and 85 She is vegetarian   Most recent labs in Feb 19, 2023 of this year, her ferritin was low at that time She is not currently taking iron  She would like a flu shot today   Lab Results  Component Value Date   TSH 3.27 01/06/2018   She was seen by hematology in 02-19-2023: ASSESSMENT/PLAN: Ms. Armbrister is a very pleasant 42 yo female with iron deficiency anemia secondary to heavy cycles. She is symptomatic with fatigue and weakness as well as chills and palpitations, with exertion.  We will see what her iron studies and other lab work show. She has tried oral iron and will add vitamin C to her diet. The iron supplement has caused some constipation.  We will plan to see her back in another 6 weeks and check a hemoglobinopathy eval at that time.  Smear report: Comment: Myeloid population consists predominantly of mature  segmented neutrophils. No immature  cells are identified.  RBCs appear to be microcytic and hypochromic on smear  review. Suggest evaluation for iron deficiency, if  clinically indicated.  Microcytosis and concurrent relative erythrocytosis  is suggestive of thalassemia, in the absence of iron  deficiency. Review of the peripheral smear reveals  adequate numbers of platelets.  Patient Active Problem List   Diagnosis Date Noted  . Anemia, iron deficiency 02/14/2015  . Subclinical hypothyroidism 02/14/2015  . Dermatitis 02/12/2015  . Preventative health care 02/12/2015    No past medical history on file.  Past Surgical History:  Procedure Laterality Date  . TUBAL LIGATION  Feb 18, 2005  . WISDOM TOOTH EXTRACTION      Social History   Tobacco Use  . Smoking status: Never Smoker  . Smokeless tobacco: Never Used  Substance Use Topics  . Alcohol use: No  . Drug use: No    Family History  Problem Relation Age of Onset  . Kidney disease Son 2       kidney cancer 14 yrs ago  . Hypertension Mother   . Heart Problems Father        hole in his heart- died in late 02-19-2023    No Known Allergies  Medication list has been reviewed and updated.  Current Outpatient Medications on File Prior to Visit  Medication Sig Dispense Refill  . betamethasone dipropionate (DIPROLENE)  0.05 % cream Apply topically 2 (two) times daily. 30 g 0  . clotrimazole-betamethasone (LOTRISONE) cream Apply 1 application topically 2 (two) times daily. 30 g 0  . ferrous sulfate 325 (65 FE) MG tablet Take 325 mg by mouth 2 (two) times daily with a meal.     No current facility-administered medications on file prior to visit.     Review of Systems:  As per HPI- otherwise negative. No fever or chills   Physical Examination: Vitals:   10/18/18 1347  BP: 118/70  Pulse: 78  Resp: 16  SpO2: 98%   Vitals:   10/18/18 1347  Weight: 125 lb (56.7 kg)  Height: 5' 0.5" (1.537 m)   Body mass index is 24.01 kg/m. Ideal Body Weight: Weight in (lb)  to have BMI = 25: 129.9  GEN: WDWN, NAD, Non-toxic, A & O x 3, petite build ,looks well  HEENT: Atraumatic, Normocephalic. Neck supple. No masses, No LAD. Ears and Nose: No external deformity. CV: RRR, No M/G/R. No JVD. No thrill. No extra heart sounds. PULM: CTA B, no wheezes, crackles, rhonchi. No retractions. No resp. distress. No accessory muscle use. ABD: S, NT, ND, +BS. No rebound. No HSM. EXTR: No c/c/e NEURO Normal gait.  PSYCH: Normally interactive. Conversant. Not depressed or anxious appearing.  Calm demeanor.    Assessment and Plan: Iron deficiency anemia, unspecified iron deficiency anemia type - Plan: CBC, Ferritin  Other fatigue - Plan: Comprehensive metabolic panel, TSH, Ferritin  Here today with concern of fatigue, she has a history of iron deficiency anemia, We will check her iron and other labs as above. Suspect the iron deficiency may be her issue again, but will await her labs and be in touch with her. Given flu shot today  Signed Lamar Blinks, MD  Received her labs and contacted her via mychart   Results for orders placed or performed in visit on 10/18/18  CBC  Result Value Ref Range   WBC 8.6 4.0 - 10.5 K/uL   RBC 4.62 3.87 - 5.11 Mil/uL   Platelets 346.0 150.0 - 400.0 K/uL   Hemoglobin 9.8 (L) 12.0 - 15.0 g/dL   HCT 31.5 (L) 36.0 - 46.0 %   MCV 68.2 Repeated and verified X2. (L) 78.0 - 100.0 fl   MCHC 31.0 30.0 - 36.0 g/dL   RDW 16.7 (H) 11.5 - 15.5 %  Comprehensive metabolic panel  Result Value Ref Range   Sodium 137 135 - 145 mEq/L   Potassium 4.7 3.5 - 5.1 mEq/L   Chloride 105 96 - 112 mEq/L   CO2 24 19 - 32 mEq/L   Glucose, Bld 84 70 - 99 mg/dL   BUN 10 6 - 23 mg/dL   Creatinine, Ser 0.58 0.40 - 1.20 mg/dL   Total Bilirubin 0.4 0.2 - 1.2 mg/dL   Alkaline Phosphatase 74 39 - 117 U/L   AST 16 0 - 37 U/L   ALT 14 0 - 35 U/L   Total Protein 7.5 6.0 - 8.3 g/dL   Albumin 4.2 3.5 - 5.2 g/dL   Calcium 9.3 8.4 - 10.5 mg/dL   GFR 121.27  >60.00 mL/min  TSH  Result Value Ref Range   TSH 3.74 0.35 - 4.50 uIU/mL  Ferritin  Result Value Ref Range   Ferritin 4.3 (L) 10.0 - 291.0 ng/mL

## 2018-10-18 ENCOUNTER — Encounter: Payer: Self-pay | Admitting: Family Medicine

## 2018-10-18 ENCOUNTER — Ambulatory Visit: Payer: BLUE CROSS/BLUE SHIELD | Admitting: Family Medicine

## 2018-10-18 VITALS — BP 118/70 | HR 78 | Resp 16 | Ht 60.5 in | Wt 125.0 lb

## 2018-10-18 DIAGNOSIS — R5383 Other fatigue: Secondary | ICD-10-CM

## 2018-10-18 DIAGNOSIS — D509 Iron deficiency anemia, unspecified: Secondary | ICD-10-CM | POA: Diagnosis not present

## 2018-10-18 NOTE — Patient Instructions (Signed)
It was good to see you today- I suspect that your fatigue is due to iron deficiency and anemia I will be in touch with your labs asap and we will treat as needed You got your flu shot today

## 2018-10-19 LAB — TSH: TSH: 3.74 u[IU]/mL (ref 0.35–4.50)

## 2018-10-19 LAB — COMPREHENSIVE METABOLIC PANEL
ALT: 14 U/L (ref 0–35)
AST: 16 U/L (ref 0–37)
Albumin: 4.2 g/dL (ref 3.5–5.2)
Alkaline Phosphatase: 74 U/L (ref 39–117)
BILIRUBIN TOTAL: 0.4 mg/dL (ref 0.2–1.2)
BUN: 10 mg/dL (ref 6–23)
CHLORIDE: 105 meq/L (ref 96–112)
CO2: 24 mEq/L (ref 19–32)
CREATININE: 0.58 mg/dL (ref 0.40–1.20)
Calcium: 9.3 mg/dL (ref 8.4–10.5)
GFR: 121.27 mL/min (ref >60.00)
Glucose, Bld: 84 mg/dL (ref 70–99)
Potassium: 4.7 mEq/L (ref 3.5–5.1)
SODIUM: 137 meq/L (ref 135–145)
Total Protein: 7.5 g/dL (ref 6.0–8.3)

## 2018-10-19 LAB — CBC
HCT: 31.5 % — ABNORMAL LOW (ref 36.0–46.0)
Hemoglobin: 9.8 g/dL — ABNORMAL LOW (ref 12.0–15.0)
MCHC: 31 g/dL (ref 30.0–36.0)
PLATELETS: 346 10*3/uL (ref 150.0–400.0)
RBC: 4.62 Mil/uL (ref 3.87–5.11)
RDW: 16.7 % — ABNORMAL HIGH (ref 11.5–15.5)
WBC: 8.6 10*3/uL (ref 4.0–10.5)

## 2018-10-19 LAB — FERRITIN: Ferritin: 4.3 ng/mL — ABNORMAL LOW (ref 10.0–291.0)

## 2018-10-21 ENCOUNTER — Telehealth: Payer: Self-pay | Admitting: Family

## 2018-10-21 NOTE — Telephone Encounter (Signed)
Re-routed to [facility] to see if something specific was advised given history. Will wait to hear and contact patient.

## 2018-10-21 NOTE — Telephone Encounter (Signed)
Author phoned pt. to review lab results from 12/23. T. Most concerned about the ferritin. Pt states she has not taken the ferrous sulfate tabs in about 2-3 months. When asked, pt stated they were making her constipated. Routed to [facility], NP to advise on next steps.

## 2018-10-21 NOTE — Telephone Encounter (Signed)
Sometimes people find Colace or MiraLax taken with ferrous sulfate helps avoid this. If she does not want to do this, we can discuss setting her up with the hematology team for possible IV iron infusions. TY.

## 2018-10-21 NOTE — Telephone Encounter (Signed)
Copied from [place] [id number]. Topic: Quick Communication - See Telephone Encounter >> Oct 21, 2018  1:35 PM Vernona Rieger wrote: CRM for notification. See Telephone encounter for: 10/21/18.  Pt is requesting her lab results from 10/18/2018, please contact pt

## 2018-10-22 ENCOUNTER — Encounter: Payer: Self-pay | Admitting: Family Medicine

## 2018-10-22 MED ORDER — FERROUS SULFATE 325 (65 FE) MG PO TABS: 325.0000 mg | ORAL_TABLET | Freq: Two times a day (BID) | ORAL | 2 refills | Status: DC

## 2018-10-22 NOTE — Telephone Encounter (Signed)
I actually saw this patient last week and ordered the labs, I would have been happy to handle this call.  Will follow-up with patient.

## 2018-10-22 NOTE — Telephone Encounter (Signed)
Restart iron, add tablespoon miralax once daily.

## 2018-10-22 NOTE — Telephone Encounter (Signed)
Author relayed [facility], providing detailed instructions on how to take both iron and miralax. Refill of iron sent to [place] per pt. Request. Pt. Wanted to know when to return for repeat ferritin lab; routed to PCP to advise. OK for [facility] to schedule lab visit per [facility] discretion.

## 2019-07-12 ENCOUNTER — Other Ambulatory Visit: Payer: Self-pay

## 2019-07-12 DIAGNOSIS — Z20822 Contact with and (suspected) exposure to covid-19: Secondary | ICD-10-CM

## 2019-07-14 LAB — NOVEL CORONAVIRUS, NAA: SARS-CoV-2, NAA: NOT DETECTED

## 2020-01-16 ENCOUNTER — Ambulatory Visit: Payer: Self-pay | Attending: Internal Medicine

## 2020-01-16 DIAGNOSIS — Z23 Encounter for immunization: Secondary | ICD-10-CM

## 2020-01-16 NOTE — Progress Notes (Signed)
   Covid-19 Vaccination Clinic  Name:  Brittany Banks    MRN: [id number] DOB: [birth year]  01/16/2020  Ms. Olkowski was observed post Covid-19 immunization for 15 minutes without incident. She was provided with Vaccine Information Sheet and instruction to access the V-Safe system.   Ms. Buen was instructed to call 911 with any severe reactions post vaccine: Marland Kitchen Difficulty breathing  . Swelling of face and throat  . A fast heartbeat  . A bad rash all over body  . Dizziness and weakness   Immunizations Administered    Name Date Dose VIS Date Route   Pfizer COVID-19 Vaccine 01/16/2020 10:08 AM 0.3 mL 10/07/2019 Intramuscular   Manufacturer: [place]   Lot: [id number]   [place]: [id number]

## 2020-02-08 ENCOUNTER — Ambulatory Visit: Payer: Self-pay | Attending: Internal Medicine

## 2020-02-08 DIAGNOSIS — Z23 Encounter for immunization: Secondary | ICD-10-CM

## 2020-02-08 NOTE — Progress Notes (Signed)
   Covid-19 Vaccination Clinic  Name:  Mardene Neeser    MRN: [id number] DOB: 1976/06/21  02/08/2020  Ms. Bielen was observed post Covid-19 immunization for 15 minutes without incident. She was provided with Vaccine Information Sheet and instruction to access the V-Safe system.   Ms. Guerino was instructed to call 911 with any severe reactions post vaccine: Marland Kitchen Difficulty breathing  . Swelling of face and throat  . A fast heartbeat  . A bad rash all over body  . Dizziness and weakness   Immunizations Administered    Name Date Dose VIS Date Route   Pfizer COVID-19 Vaccine 02/08/2020 11:02 AM 0.3 mL 10/07/2019 Intramuscular   Manufacturer: [place]   Lot: [id number]   [place]: [id number]

## 2020-02-26 ENCOUNTER — Emergency Department (HOSPITAL_BASED_OUTPATIENT_CLINIC_OR_DEPARTMENT_OTHER)
Admission: EM | Admit: 2020-02-26 | Discharge: 2020-02-26 | Disposition: A | Discharge status: Home / Self Care | Payer: 59 | Attending: Emergency Medicine | Admitting: Emergency Medicine

## 2020-02-26 ENCOUNTER — Emergency Department (HOSPITAL_BASED_OUTPATIENT_CLINIC_OR_DEPARTMENT_OTHER): Payer: 59

## 2020-02-26 ENCOUNTER — Encounter (HOSPITAL_BASED_OUTPATIENT_CLINIC_OR_DEPARTMENT_OTHER): Payer: Self-pay | Admitting: *Deleted

## 2020-02-26 ENCOUNTER — Other Ambulatory Visit: Payer: Self-pay

## 2020-02-26 DIAGNOSIS — M25551 Pain in right hip: Secondary | ICD-10-CM | POA: Insufficient documentation

## 2020-02-26 DIAGNOSIS — Z79899 Other long term (current) drug therapy: Secondary | ICD-10-CM | POA: Diagnosis not present

## 2020-02-26 DIAGNOSIS — R1031 Right lower quadrant pain: Secondary | ICD-10-CM | POA: Diagnosis present

## 2020-02-26 LAB — URINALYSIS, ROUTINE W REFLEX MICROSCOPIC
Bilirubin Urine: NEGATIVE
Glucose, UA: NEGATIVE mg/dL
Hgb urine dipstick: NEGATIVE
Ketones, ur: NEGATIVE mg/dL
Leukocytes,Ua: NEGATIVE
Nitrite: NEGATIVE
Protein, ur: NEGATIVE mg/dL
Specific Gravity, Urine: 1.015 (ref 1.005–1.030)
pH: 7 (ref 5.0–8.0)

## 2020-02-26 LAB — PREGNANCY, URINE: Preg Test, Ur: NEGATIVE

## 2020-02-26 MED ORDER — MELOXICAM 7.5 MG PO TABS
7.5000 mg | ORAL_TABLET | Freq: Every day | ORAL | 0 refills | Status: AC
Start: 2020-02-26 — End: 2020-03-11

## 2020-02-26 MED ORDER — KETOROLAC TROMETHAMINE 15 MG/ML IJ SOLN
15.0000 mg | Freq: Once | INTRAMUSCULAR | Status: AC
  Administered 2020-02-26: 15 mg via INTRAMUSCULAR
  Filled 2020-02-26: qty 1

## 2020-02-26 NOTE — Discharge Instructions (Addendum)
Take meloxicam daily as prescribed for pain.  Recommend warm compresses to your right hip for 20 minutes at a time 3 times daily.  Follow-up with your primary care provider for recheck if pain persists.

## 2020-02-26 NOTE — ED Provider Notes (Signed)
[facility]ERGENCY DEPARTMENT Provider Note   CSN: [id number] Arrival date & time: 02/26/20  1420     History Chief Complaint  Patient presents with  . Groin Pain    Brittany Banks is a 44 y.o. female.  44 year old female presents with complaint of pain in her right groin onset yesterday without injury.  Patient states that she had a similar pain last week however pain was self-limiting and resolved without intervention.  Pain is worse with ambulating and movement of the hip, denies any changes to her skin or palpable pain, denies abdominal pain.  No other complaints or concerns.        History reviewed. No pertinent past medical history.  Patient Active Problem List   Diagnosis Date Noted  . Anemia, iron deficiency 02/14/2015  . Subclinical hypothyroidism 02/14/2015  . Dermatitis 02/12/2015  . Preventative health care 02/12/2015    Past Surgical History:  Procedure Laterality Date  . TUBAL LIGATION  2006  . WISDOM TOOTH EXTRACTION       OB History   No obstetric history on file.     Family History  Problem Relation Age of Onset  . Kidney disease Son 2       kidney cancer 14 yrs ago  . Hypertension Mother   . Heart Problems Father        hole in his heart- died in late 64's    Social History   Tobacco Use  . Smoking status: Never Smoker  . Smokeless tobacco: Never Used  Substance Use Topics  . Alcohol use: No  . Drug use: No    Home Medications Prior to Admission medications   Medication Sig Start Date End Date Taking? Authorizing Provider  betamethasone dipropionate (DIPROLENE) 0.05 % cream Apply topically 2 (two) times daily. 05/18/15   Debbrah Alar, NP  clotrimazole-betamethasone (LOTRISONE) cream Apply 1 application topically 2 (two) times daily. 02/12/15   Debbrah Alar, NP  ferrous sulfate 325 (65 FE) MG tablet Take 1 tablet (325 mg total) by mouth 2 (two) times daily with a meal. 10/22/18   Debbrah Alar, NP    meloxicam (MOBIC) 7.5 MG tablet Take 1 tablet (7.5 mg total) by mouth daily for 14 days. 02/26/20 03/11/20  Tacy Learn, PA-C    Allergies    Patient has no known allergies.  Review of Systems   Review of Systems  Constitutional: Negative for fever.  Gastrointestinal: Negative for abdominal pain, constipation and diarrhea.  Musculoskeletal: Positive for arthralgias, gait problem and myalgias. Negative for back pain and joint swelling.  Skin: Negative for rash and wound.  Allergic/Immunologic: Negative for immunocompromised state.  Neurological: Negative for weakness and numbness.  Hematological: Negative for adenopathy.    Physical Exam Updated Vital Signs BP 124/84 (BP Location: Left Arm)   Pulse 86   Temp 98.7 F (37.1 C) (Oral)   Resp 18   Ht 5' (1.524 m)   Wt 64.4 kg   LMP 01/24/2020   SpO2 100%   BMI 27.73 kg/m   Physical Exam Vitals and nursing note reviewed.  Constitutional:      General: She is not in acute distress.    Appearance: She is well-developed. She is not diaphoretic.  HENT:     Head: Normocephalic and atraumatic.  Cardiovascular:     Pulses: Normal pulses.  Pulmonary:     Effort: Pulmonary effort is normal.  Musculoskeletal:        General: No swelling, tenderness or deformity.  Right lower leg: No edema.     Left lower leg: No edema.     Comments: Pain in right hip with abduction and flexion.  No palpable lymphadenopathy, no overlying skin changes.  No pain with palpation of the hip or groin areas.  Skin:    General: Skin is warm and dry.     Findings: No erythema or rash.  Neurological:     Mental Status: She is alert and oriented to person, place, and time.  Psychiatric:        Behavior: Behavior normal.     ED Results / Procedures / Treatments   Labs (all labs ordered are listed, but only abnormal results are displayed) Labs Reviewed  URINALYSIS, ROUTINE W REFLEX MICROSCOPIC  PREGNANCY, URINE    EKG None  Radiology DG  Hip Unilat With Pelvis 2-3 Views Right  Result Date: 02/26/2020 CLINICAL DATA:  right groin pain since yesterday. Denies injury. EXAM: DG HIP (WITH OR WITHOUT PELVIS) 2-3V RIGHT COMPARISON:  None. FINDINGS: There is no evidence of hip fracture or dislocation. There is no evidence of arthropathy or other focal bone abnormality. The regional soft tissues are unremarkable. IMPRESSION: Negative radiographs of the right hip. Electronically Signed   By: Audie Pinto M.D.   On: 02/26/2020 16:19    Procedures Procedures (including critical care time)  Medications Ordered in ED Medications  ketorolac (TORADOL) 15 MG/ML injection 15 mg (has no administration in time range)    ED Course  I have reviewed the triage vital signs and the nursing notes.  Pertinent labs & imaging results that were available during my care of the patient were reviewed by me and considered in my medical decision making (see chart for details).  Clinical Course as of Feb 25 1630  Sun Feb 25, 6466  6173 44 year old female with complaint of right hip pain without injury.  No recent URI.  On exam has pain with adduction and flexion of the right hip.  No external skin changes, no palpable tenderness, no lymphadenopathy.  X-ray of the right hip is unremarkable.  Patient be treated with injection of Toradol in the ER, discharged with course of meloxicam and recommend warm compresses and follow-up with PCP if pain persist.   [LM]    Clinical Course User Index [LM] Roque Lias   MDM Rules/Calculators/A&P                      Final Clinical Impression(s) / ED Diagnoses Final diagnoses:  Right hip pain    Rx / DC Orders ED Discharge Orders         Ordered    meloxicam (MOBIC) 7.5 MG tablet  Daily     02/26/20 1628           Tacy Learn, PA-C 02/26/20 1631    Veryl Speak, MD 02/26/20 2306

## 2020-02-26 NOTE — ED Triage Notes (Addendum)
Pt reports right groin pain since yesterday. Denies injury. Ambulated to triage with limp

## 2020-07-24 ENCOUNTER — Other Ambulatory Visit: Payer: Self-pay

## 2020-07-24 ENCOUNTER — Encounter: Payer: Self-pay | Admitting: Family

## 2020-07-24 ENCOUNTER — Ambulatory Visit (INDEPENDENT_AMBULATORY_CARE_PROVIDER_SITE_OTHER): Payer: 59 | Admitting: Family

## 2020-07-24 ENCOUNTER — Telehealth: Payer: Self-pay | Admitting: Family

## 2020-07-24 VITALS — BP 112/72 | HR 77 | Temp 98.6°F | Resp 16 | Ht 60.5 in | Wt 133.0 lb

## 2020-07-24 DIAGNOSIS — D649 Anemia, unspecified: Secondary | ICD-10-CM

## 2020-07-24 DIAGNOSIS — Z23 Encounter for immunization: Secondary | ICD-10-CM

## 2020-07-24 DIAGNOSIS — Z0000 Encounter for general adult medical examination without abnormal findings: Secondary | ICD-10-CM

## 2020-07-24 NOTE — Progress Notes (Signed)
Subjective:    Patient ID: Brittany Banks, female    DOB: April 22, 1976, 44 y.o.   MRN: [id number]  HPI  Patient presents today for complete physical.  Immunizations: pfizer series complete, Tdap 2016, flu shot today Diet: reports healthy diet (vegetarian) Exercise: little bit of walking Pap Smear: up to date Mammogram:due Dental: up to date Vision: due Wt Readings from Last 3 Encounters:  07/24/20 133 lb (60.3 kg)  02/26/20 142 lb (64.4 kg)  10/18/18 125 lb (56.7 kg)       Review of Systems  Constitutional: Negative for unexpected weight change.  HENT: Negative for hearing loss and rhinorrhea.   Eyes: Negative for visual disturbance.  Respiratory: Negative for cough and shortness of breath.   Cardiovascular: Negative for chest pain.  Gastrointestinal: Negative for constipation and diarrhea.  Genitourinary: Negative for dysuria, frequency, hematuria and menstrual problem.  Musculoskeletal: Negative for arthralgias and myalgias.  Skin: Negative for rash.  Neurological: Negative for headaches.  Hematological: Negative for adenopathy.  Psychiatric/Behavioral:       Denies depression/anxiety   No past medical history on file.   Social History   Socioeconomic History  . Marital status: Married    Spouse name: Not on file  . Number of children: Not on file  . Years of education: Not on file  . Highest education level: Not on file  Occupational History  . Not on file  Tobacco Use  . Smoking status: Never Smoker  . Smokeless tobacco: Never Used  Substance and Sexual Activity  . Alcohol use: No  . Drug use: No  . Sexual activity: Not on file  Other Topics Concern  . Not on file  Social History Narrative   Works at [employer] in [place]- moved here 2010   Completed bachelors in [place]   2 children (2 sons) 57 and 48 yrs old   Married   Enjoys spending time with her children.     Social Determinants of Health   Financial Resource Strain:   .  Difficulty of Paying Living Expenses: Not on file  Food Insecurity:   . Worried About Charity fundraiser in the Last Year: Not on file  . Ran Out of Food in the Last Year: Not on file  Transportation Needs:   . Lack of Transportation (Medical): Not on file  . Lack of Transportation (Non-Medical): Not on file  Physical Activity:   . Days of Exercise per Week: Not on file  . Minutes of Exercise per Session: Not on file  Stress:   . Feeling of Stress : Not on file  Social Connections:   . Frequency of Communication with Friends and Family: Not on file  . Frequency of Social Gatherings with Friends and Family: Not on file  . Attends Religious Services: Not on file  . Active Member of Clubs or Organizations: Not on file  . Attends Archivist Meetings: Not on file  . Marital Status: Not on file  Intimate Partner Violence:   . Fear of Current or Ex-Partner: Not on file  . Emotionally Abused: Not on file  . Physically Abused: Not on file  . Sexually Abused: Not on file    Past Surgical History:  Procedure Laterality Date  . TUBAL LIGATION  2006  . WISDOM TOOTH EXTRACTION      Family History  Problem Relation Age of Onset  . Kidney disease Son 2       kidney cancer  14 yrs ago  . Hypertension Mother   . Heart Problems Father        hole in his heart- died in late 02-04-2023    No Known Allergies  No current outpatient medications on file prior to visit.   No current facility-administered medications on file prior to visit.    BP 112/72 (BP Location: Right Arm, Patient Position: Sitting, Cuff Size: Small)   Pulse 77   Temp 98.6 F (37 C) (Oral)   Resp 16   Ht 5' 0.5" (1.537 m)   Wt 133 lb (60.3 kg)   SpO2 100%   BMI 25.55 kg/m       Objective:   Physical Exam  Physical Exam  Constitutional: She is oriented to person, place, and time. She appears well-developed and well-nourished. No distress.  HENT:  Head: Normocephalic and atraumatic.  Right Ear:  Tympanic membrane and ear canal normal.  Left Ear: Tympanic membrane and ear canal normal.  Mouth/Throat: Not examined- pt wearing mask Eyes: Pupils are equal, round, and reactive to light. No scleral icterus.  Neck: Normal range of motion. No thyromegaly present.  Cardiovascular: Normal rate and regular rhythm.   No murmur heard. Pulmonary/Chest: Effort normal and breath sounds normal. No respiratory distress. He has no wheezes. She has no rales. She exhibits no tenderness.  Abdominal: Soft. Bowel sounds are normal. She exhibits no distension and no mass. There is no tenderness. There is no rebound and no guarding.  Musculoskeletal: She exhibits no edema.  Lymphadenopathy:    She has no cervical adenopathy.  Neurological: She is alert and oriented to person, place, and time. She has normal patellar reflexes. She exhibits normal muscle tone. Coordination normal.  Skin: Skin is warm and dry.  Psychiatric: She has a normal mood and affect. Her behavior is normal. Judgment and thought content normal.  Breast/Pelvic: deferred           Assessment & Plan:  Preventative care- discussed healthy diet, exercise and weight loss. Discussed goal weight of 125lb. She will schedule mammogram on the first floor. Had pap performed by GYN- will obtain copy of pap report for her records. Flu shot today. Covid and tetanus shot is up to date.  Labs as ordered.  This visit occurred during the SARS-CoV-2 public health emergency.  Safety protocols were in place, including screening questions prior to the visit, additional usage of staff PPE, and extensive cleaning of exam room while observing appropriate contact time as indicated for disinfecting solutions.          Assessment & Plan:

## 2020-07-24 NOTE — Patient Instructions (Addendum)
Please scheduled mammogram and eye exams.  Continue your work on [employer]. Try to increase walking to 30 minutes 5 days a week.    Preventive Care 38-43 Years Old, Female Preventive care refers to visits with your health care provider and lifestyle choices that can promote health and wellness. This includes:  A yearly physical exam. This may also be called an annual well check.  Regular dental visits and eye exams.  Immunizations.  Screening for certain conditions.  Healthy lifestyle choices, such as eating a healthy diet, getting regular exercise, not using drugs or products that contain nicotine and tobacco, and limiting alcohol use. What can I expect for my preventive care visit? Physical exam Your health care provider will check your:  Height and weight. This may be used to calculate body mass index (BMI), which tells if you are at a healthy weight.  Heart rate and blood pressure.  Skin for abnormal spots. Counseling Your health care provider may ask you questions about your:  Alcohol, tobacco, and drug use.  Emotional well-being.  Home and relationship well-being.  Sexual activity.  Eating habits.  Work and work Statistician.  Method of birth control.  Menstrual cycle.  Pregnancy history. What immunizations do I need?  Influenza (flu) vaccine  This is recommended every year. Tetanus, diphtheria, and pertussis (Tdap) vaccine  You may need a Td booster every 10 years. Varicella (chickenpox) vaccine  You may need this if you have not been vaccinated. Zoster (shingles) vaccine  You may need this after age 43. Measles, mumps, and rubella (MMR) vaccine  You may need at least one dose of MMR if you were born in 1957 or later. You may also need a second dose. Pneumococcal conjugate (PCV13) vaccine  You may need this if you have certain conditions and were not previously vaccinated. Pneumococcal polysaccharide (PPSV23) vaccine  You may need one or  two doses if you smoke cigarettes or if you have certain conditions. Meningococcal conjugate (MenACWY) vaccine  You may need this if you have certain conditions. Hepatitis A vaccine  You may need this if you have certain conditions or if you travel or work in places where you may be exposed to hepatitis A. Hepatitis B vaccine  You may need this if you have certain conditions or if you travel or work in places where you may be exposed to hepatitis B. Haemophilus influenzae type b (Hib) vaccine  You may need this if you have certain conditions. Human papillomavirus (HPV) vaccine  If recommended by your health care provider, you may need three doses over 6 months. You may receive vaccines as individual doses or as more than one vaccine together in one shot (combination vaccines). Talk with your health care provider about the risks and benefits of combination vaccines. What tests do I need? Blood tests  Lipid and cholesterol levels. These may be checked every 5 years, or more frequently if you are over 43 years old.  Hepatitis C test.  Hepatitis B test. Screening  Lung cancer screening. You may have this screening every year starting at age 43 if you have a 30-pack-year history of smoking and currently smoke or have quit within the past 15 years.  Colorectal cancer screening. All adults should have this screening starting at age 43 and continuing until age 12. Your health care provider may recommend screening at age 43 if you are at increased risk. You will have tests every 1-10 years, depending on your results and the type of  screening test.  Diabetes screening. This is done by checking your blood sugar (glucose) after you have not eaten for a while (fasting). You may have this done every 1-3 years.  Mammogram. This may be done every 1-2 years. Talk with your health care provider about when you should start having regular mammograms. This may depend on whether you have a family history  of breast cancer.  BRCA-related cancer screening. This may be done if you have a family history of breast, ovarian, tubal, or peritoneal cancers.  Pelvic exam and Pap test. This may be done every 3 years starting at age 21. Starting at age 30, this may be done every 5 years if you have a Pap test in combination with an HPV test. Other tests  Sexually transmitted disease (STD) testing.  Bone density scan. This is done to screen for osteoporosis. You may have this scan if you are at high risk for osteoporosis. Follow these instructions at home: Eating and drinking  Eat a diet that includes fresh fruits and vegetables, whole grains, lean protein, and low-fat dairy.  Take vitamin and mineral supplements as recommended by your health care provider.  Do not drink alcohol if: ? Your health care provider tells you not to drink. ? You are pregnant, may be pregnant, or are planning to become pregnant.  If you drink alcohol: ? Limit how much you have to 0-1 drink a day. ? Be aware of how much alcohol is in your drink. In the [place] one drink equals one 12 oz bottle of beer (355 mL), one 5 oz glass of wine (148 mL), or one 1 oz glass of hard liquor (44 mL). Lifestyle  Take daily care of your teeth and gums.  Stay active. Exercise for at least 30 minutes on 5 or more days each week.  Do not use any products that contain nicotine or tobacco, such as cigarettes, e-cigarettes, and chewing tobacco. If you need help quitting, ask your health care provider.  If you are sexually active, practice safe sex. Use a condom or other form of birth control (contraception) in order to prevent pregnancy and STIs (sexually transmitted infections).  If told by your health care provider, take low-dose aspirin daily starting at age 43. What's next?  Visit your health care provider once a year for a well check visit.  Ask your health care provider how often you should have your eyes and teeth checked.  Stay up  to date on all vaccines. This information is not intended to replace advice given to you by your health care provider. Make sure you discuss any questions you have with your health care provider. Document Revised: 06/24/2018 Document Reviewed: 06/24/2018 Elsevier Patient Education  2020 Elsevier Inc.  

## 2020-07-24 NOTE — Telephone Encounter (Signed)
Request faxed to Dr. Orest Dikes office.

## 2020-07-24 NOTE — Telephone Encounter (Signed)
Please call Dr. Theodoro Grist office and request copy of pap

## 2020-07-25 LAB — CBC WITH DIFFERENTIAL/PLATELET
Absolute Monocytes: 585 cells/uL (ref 200–950)
Basophils Absolute: 119 cells/uL (ref 0–200)
Basophils Relative: 1.5 %
Eosinophils Absolute: 450 cells/uL (ref 15–500)
Eosinophils Relative: 5.7 %
HCT: 29.8 % — ABNORMAL LOW (ref 35.0–45.0)
Hemoglobin: 8.4 g/dL — ABNORMAL LOW (ref 11.7–15.5)
Lymphs Abs: 2165 cells/uL (ref 850–3900)
MCH: 17.6 pg — ABNORMAL LOW (ref 27.0–33.0)
MCHC: 28.2 g/dL — ABNORMAL LOW (ref 32.0–36.0)
MCV: 62.3 fL — ABNORMAL LOW (ref 80.0–100.0)
MPV: 11.2 fL (ref 7.5–12.5)
Monocytes Relative: 7.4 %
Neutro Abs: 4582 cells/uL (ref 1500–7800)
Neutrophils Relative %: 58 %
Platelets: 455 10*3/uL — ABNORMAL HIGH (ref 140–400)
RBC: 4.78 10*6/uL (ref 3.80–5.10)
RDW: 18.4 % — ABNORMAL HIGH (ref 11.0–15.0)
Total Lymphocyte: 27.4 %
WBC: 7.9 10*3/uL (ref 3.8–10.8)

## 2020-07-25 LAB — CBC MORPHOLOGY

## 2020-07-25 LAB — B12 AND FOLATE PANEL
Folate: 7.5 ng/mL
Vitamin B-12: 485 pg/mL (ref 200–1100)

## 2020-07-25 LAB — IRON: Iron: 32 ug/dL — ABNORMAL LOW (ref 40–190)

## 2020-07-26 ENCOUNTER — Ambulatory Visit (HOSPITAL_BASED_OUTPATIENT_CLINIC_OR_DEPARTMENT_OTHER): Payer: 59

## 2020-07-26 ENCOUNTER — Telehealth: Payer: Self-pay | Admitting: Family

## 2020-07-26 ENCOUNTER — Other Ambulatory Visit: Payer: Self-pay

## 2020-07-26 DIAGNOSIS — D649 Anemia, unspecified: Secondary | ICD-10-CM

## 2020-07-26 NOTE — Telephone Encounter (Signed)
Patient advised of results and provider's advise. She verbalized understanding and will get otc iron. She was scheduled to come in for labs in one mont and son will pick up IFOB on 07-31-20,per patient.  All orders entered as future.

## 2020-07-26 NOTE — Telephone Encounter (Signed)
Iron is low, anemia has worsened.  I would llike her to add iron 325mg  bid.   Repeat CBC, serum iron in 1 month.  Is she having heavy periods?   Also, I would like her to complete an IFOB, dx anemia.

## 2020-07-31 ENCOUNTER — Encounter (HOSPITAL_BASED_OUTPATIENT_CLINIC_OR_DEPARTMENT_OTHER): Payer: Self-pay

## 2020-07-31 ENCOUNTER — Ambulatory Visit (HOSPITAL_BASED_OUTPATIENT_CLINIC_OR_DEPARTMENT_OTHER)
Admission: RE | Admit: 2020-07-31 | Discharge: 2020-07-31 | Disposition: A | Discharge status: Home / Self Care | Payer: 59 | Source: Ambulatory Visit | Attending: Family | Admitting: Family

## 2020-07-31 ENCOUNTER — Other Ambulatory Visit: Payer: Self-pay

## 2020-07-31 DIAGNOSIS — Z1231 Encounter for screening mammogram for malignant neoplasm of breast: Secondary | ICD-10-CM | POA: Insufficient documentation

## 2020-07-31 DIAGNOSIS — N6489 Other specified disorders of breast: Secondary | ICD-10-CM | POA: Diagnosis not present

## 2020-07-31 DIAGNOSIS — Z0000 Encounter for general adult medical examination without abnormal findings: Secondary | ICD-10-CM

## 2020-08-03 ENCOUNTER — Other Ambulatory Visit: Payer: Self-pay | Admitting: Family

## 2020-08-03 DIAGNOSIS — R928 Other abnormal and inconclusive findings on diagnostic imaging of breast: Secondary | ICD-10-CM

## 2020-08-05 ENCOUNTER — Telehealth: Payer: Self-pay | Admitting: Family

## 2020-08-05 NOTE — Telephone Encounter (Signed)
Please let pt know that the radiologist would like her to complete some additional breast images for further evaluation. Let me know if she has not been contacted by them about a follow up appointment in 1 week.   

## 2020-08-08 NOTE — Telephone Encounter (Signed)
Patient scheduled for additional images on 08-21-20

## 2020-08-20 ENCOUNTER — Other Ambulatory Visit: Payer: 59

## 2020-08-21 ENCOUNTER — Other Ambulatory Visit: Payer: Self-pay | Admitting: Family

## 2020-08-21 ENCOUNTER — Other Ambulatory Visit: Payer: Self-pay

## 2020-08-21 ENCOUNTER — Ambulatory Visit
Admission: RE | Admit: 2020-08-21 | Discharge: 2020-08-21 | Disposition: A | Discharge status: Home / Self Care | Payer: 59 | Source: Ambulatory Visit | Attending: Family | Admitting: Family

## 2020-08-21 DIAGNOSIS — R928 Other abnormal and inconclusive findings on diagnostic imaging of breast: Secondary | ICD-10-CM

## 2020-08-30 ENCOUNTER — Other Ambulatory Visit: Payer: 59

## 2020-08-30 NOTE — Addendum Note (Signed)
Addended by: Kelle Darting A on: 08/30/2020 02:24 PM   Modules accepted: Orders

## 2020-09-03 ENCOUNTER — Other Ambulatory Visit: Payer: 59

## 2020-09-06 ENCOUNTER — Other Ambulatory Visit: Payer: 59

## 2020-09-26 ENCOUNTER — Other Ambulatory Visit: Payer: Self-pay

## 2020-09-26 ENCOUNTER — Ambulatory Visit
Admission: RE | Admit: 2020-09-26 | Discharge: 2020-09-26 | Disposition: A | Discharge status: Home / Self Care | Payer: 59 | Source: Ambulatory Visit | Attending: Family | Admitting: Family

## 2020-09-26 DIAGNOSIS — R928 Other abnormal and inconclusive findings on diagnostic imaging of breast: Secondary | ICD-10-CM

## 2020-09-26 HISTORY — PX: BREAST BIOPSY: SHX20

## 2021-07-26 ENCOUNTER — Encounter: Payer: Self-pay | Admitting: Family

## 2021-07-26 ENCOUNTER — Other Ambulatory Visit: Payer: Self-pay

## 2021-07-26 ENCOUNTER — Telehealth: Payer: Self-pay | Admitting: Family Medicine

## 2021-07-26 ENCOUNTER — Ambulatory Visit (INDEPENDENT_AMBULATORY_CARE_PROVIDER_SITE_OTHER): Payer: 59 | Admitting: Family

## 2021-07-26 VITALS — BP 112/77 | HR 84 | Temp 98.3°F | Resp 16 | Ht 60.0 in | Wt 123.0 lb

## 2021-07-26 DIAGNOSIS — R739 Hyperglycemia, unspecified: Secondary | ICD-10-CM

## 2021-07-26 DIAGNOSIS — D649 Anemia, unspecified: Secondary | ICD-10-CM | POA: Insufficient documentation

## 2021-07-26 DIAGNOSIS — E038 Other specified hypothyroidism: Secondary | ICD-10-CM | POA: Diagnosis not present

## 2021-07-26 DIAGNOSIS — M898X9 Other specified disorders of bone, unspecified site: Secondary | ICD-10-CM

## 2021-07-26 DIAGNOSIS — M8580 Other specified disorders of bone density and structure, unspecified site: Secondary | ICD-10-CM

## 2021-07-26 DIAGNOSIS — Z0000 Encounter for general adult medical examination without abnormal findings: Secondary | ICD-10-CM

## 2021-07-26 DIAGNOSIS — D509 Iron deficiency anemia, unspecified: Secondary | ICD-10-CM

## 2021-07-26 DIAGNOSIS — Z23 Encounter for immunization: Secondary | ICD-10-CM | POA: Diagnosis not present

## 2021-07-26 LAB — COMPREHENSIVE METABOLIC PANEL
ALT: 14 U/L (ref 0–35)
AST: 13 U/L (ref 0–37)
Albumin: 4.1 g/dL (ref 3.5–5.2)
Alkaline Phosphatase: 75 U/L (ref 39–117)
BUN: 13 mg/dL (ref 6–23)
CO2: 25 mEq/L (ref 19–32)
Calcium: 9.2 mg/dL (ref 8.4–10.5)
Chloride: 105 mEq/L (ref 96–112)
Creatinine, Ser: 0.58 mg/dL (ref 0.40–1.20)
GFR: 109.93 mL/min (ref >60.00)
Glucose, Bld: 85 mg/dL (ref 70–99)
Potassium: 4.1 mEq/L (ref 3.5–5.1)
Sodium: 138 mEq/L (ref 135–145)
Total Bilirubin: 0.7 mg/dL (ref 0.2–1.2)
Total Protein: 7.7 g/dL (ref 6.0–8.3)

## 2021-07-26 LAB — FERRITIN: Ferritin: 2.7 ng/mL — ABNORMAL LOW (ref 10.0–291.0)

## 2021-07-26 LAB — CBC WITH DIFFERENTIAL/PLATELET
Basophils Absolute: 0.1 10*3/uL (ref 0.0–0.1)
Basophils Relative: 1.6 % (ref 0.0–3.0)
Eosinophils Absolute: 0.5 10*3/uL (ref 0.0–0.7)
Eosinophils Relative: 8.1 % — ABNORMAL HIGH (ref 0.0–5.0)
HCT: 25.8 % — ABNORMAL LOW (ref 36.0–46.0)
Hemoglobin: 7.7 g/dL — CL (ref 12.0–15.0)
Lymphocytes Relative: 30.3 % (ref 12.0–46.0)
Lymphs Abs: 1.9 10*3/uL (ref 0.7–4.0)
MCHC: 29.9 g/dL — ABNORMAL LOW (ref 30.0–36.0)
MCV: 57.7 fl — ABNORMAL LOW (ref 78.0–100.0)
Monocytes Absolute: 0.4 10*3/uL (ref 0.1–1.0)
Monocytes Relative: 6.4 % (ref 3.0–12.0)
Neutro Abs: 3.3 10*3/uL (ref 1.4–7.7)
Neutrophils Relative %: 53.6 % (ref 43.0–77.0)
Platelets: 385 10*3/uL (ref 150.0–400.0)
RBC: 4.48 Mil/uL (ref 3.87–5.11)
RDW: 18.7 % — ABNORMAL HIGH (ref 11.5–15.5)
WBC: 6.2 10*3/uL (ref 4.0–10.5)

## 2021-07-26 LAB — TSH: TSH: 3.05 u[IU]/mL (ref 0.35–5.50)

## 2021-07-26 LAB — T3, FREE: T3, Free: 3.9 pg/mL (ref 2.3–4.2)

## 2021-07-26 LAB — IRON: Iron: 7 ug/dL — ABNORMAL LOW (ref 42–145)

## 2021-07-26 LAB — T4, FREE: Free T4: 0.92 ng/dL (ref 0.60–1.60)

## 2021-07-26 MED ORDER — ONE-A-DAY WOMENS FORMULA PO TABS: 1.0000 | ORAL_TABLET | Freq: Every day | ORAL | 0 refills | Status: AC

## 2021-07-26 NOTE — Patient Instructions (Addendum)
Please call [facility] and schedule your routine GYN exam.[phone] Try adding claritin 10mg  once daily as needed for allergy symptoms. Try to add 30 minutes 5 days a week of walking.  Add a women's one a day multivitamin.

## 2021-07-26 NOTE — Assessment & Plan Note (Signed)
Patient is advised as follows:  Please call [facility] and schedule your routine GYN [phone] Try adding claritin 10mg  once daily as needed for allergy symptoms. Try to add 30 minutes 5 days a week of walking.  Add a women's one a day multivitamin. Refer for mammogram.

## 2021-07-26 NOTE — Progress Notes (Signed)
 Subjective:     Patient ID: Brittany Banks, female    DOB: 05/19/1976, 45 y.o.   MRN: [id number]  Chief Complaint  Patient presents with   Annual Exam    HPI  Patient presents today for complete physical.  Immunizations: tdap 2016, flu shot today, pfizer x 2.  Diet: reports healthy vegetarian diet Wt Readings from Last 3 Encounters:  07/26/21 123 lb (55.8 kg)  07/24/20 133 lb (60.3 kg)  02/26/20 142 lb (64.4 kg)   She is eating less due to extensive dental work. Exercise: not exercising.  Colonoscopy due next year Pap Smear: up to date per GYN Mammogram: due end of October. Dental: up to date  Pt's dentist told her she has bone loss in her upper jaw.  She is now in the process of obtaining dental implants.    Anemia- She did not complete IFOB after last visit.  Health Maintenance Due  Topic Date Due   HIV Screening  Never done   Hepatitis C Screening  Never done   COVID-19 Vaccine (3 - Booster for Pfizer series) 07/10/2020   PAP SMEAR-Modifier  01/28/2021    No past medical history on file.  Past Surgical History:  Procedure Laterality Date   dental implant  Right    2022   TUBAL LIGATION  10/27/2004   WISDOM TOOTH EXTRACTION      Family History  Problem Relation Age of Onset   Hypertension Mother    Heart Problems Father        hole in his heart- died in late 30's   Alcoholism Brother        died from allergic reaction from treatment   Liver disease Brother    Kidney disease Son 2       kidney cancer 14 yrs ago    Social History   Socioeconomic History   Marital status: Married    Spouse name: Not on file   Number of children: Not on file   Years of education: Not on file   Highest education level: Not on file  Occupational History   Not on file  Tobacco Use   Smoking status: Never   Smokeless tobacco: Never  Substance and Sexual Activity   Alcohol use: No   Drug use: No   Sexual activity: Yes    Partners: Male    Birth  control/protection: Surgical  Other Topics Concern   Not on file  Social History Narrative   Works at a convenience store   Born in [place]- moved here 2010   Completed bachelors in [place]   2 children (2 sons) 16 and 22 yrs old   Married   Enjoys spending time with her children.     Social Determinants of Health   Financial Resource Strain: Not on file  Food Insecurity: Not on file  Transportation Needs: Not on file  Physical Activity: Not on file  Stress: Not on file  Social Connections: Not on file  Intimate Partner Violence: Not on file    No outpatient medications prior to visit.   No facility-administered medications prior to visit.    No Known Allergies  Review of Systems  Constitutional:  Positive for weight loss (due to dental issues).  HENT:  Negative for congestion.        Notes some itching in the back of her throat recently. Attributes to allergies.   Eyes:  Negative for blurred vision and double vision.  Respiratory:  Negative for cough.     Cardiovascular:  Negative for chest pain.  Gastrointestinal:  Negative for constipation and diarrhea.  Genitourinary:  Negative for dysuria and frequency.  Musculoskeletal:  Negative for joint pain and myalgias.  Neurological:  Negative for headaches.  Endo/Heme/Allergies:  Positive for environmental allergies.  Psychiatric/Behavioral:         Denies depression/anxiety symptoms      Objective:    Physical Exam  BP 112/77 (BP Location: Right Arm, Patient Position: Sitting, Cuff Size: Small)   Pulse 84   Temp 98.3 F (36.8 C) (Oral)   Resp 16   Ht 5' (1.524 m)   Wt 123 lb (55.8 kg)   SpO2 100%   BMI 24.02 kg/m  Wt Readings from Last 3 Encounters:  07/26/21 123 lb (55.8 kg)  07/24/20 133 lb (60.3 kg)  02/26/20 142 lb (64.4 kg)       Assessment & Plan:   Problem List Items Addressed This Visit       Unprioritized   Subclinical hypothyroidism   Relevant Orders   TSH (Completed)   T3, free (Completed)    T4, free (Completed)   Preventative health care - Primary    Patient is advised as follows:  Please call [facility] and schedule your routine GYN exam.[phone] Try adding claritin 64m once daily as needed for allergy symptoms. Try to add 30 minutes 5 days a week of walking.  Add a women's one a day multivitamin. Refer for mammogram.       Relevant Orders   MM 3D SCREEN BREAST BILATERAL   Hyperglycemia   Relevant Orders   Comp Met (CMET) (Completed)   Anemia   Relevant Orders   CBC with Differential/Platelet (Completed)   Iron (Completed)   Iron Binding Cap (TIBC)([facility])   Ferritin (Completed)   Fecal occult blood, imunochemical([facility])   Hgb Fractionation Cascade   Fecal occult blood, imunochemical([facility])   Other Visit Diagnoses     Needs flu shot       Relevant Orders   Flu Vaccine QUAD 6+ mos PF IM (Fluarix Quad PF) (Completed)   Bone loss       Relevant Orders   DG Bone Density       I am having Brittany Banks start on One-A-Day Womens Formula.  Meds ordered this encounter  Medications   Multiple Vitamins-Calcium (ONE-A-DAY WOMENS FORMULA) TABS    Sig: Take 1 tablet by mouth daily.    Refill:  0    Order Specific Question:   Supervising Provider    Answer:   BPenni HomansA [[id number]

## 2021-07-26 NOTE — Telephone Encounter (Addendum)
Received call on critical lab value of a hemoglobin of 7.7.  I called and spoke with the patient.  She notes no current bleeding.  She denies chest pain, shortness of breath, lightheadedness, or any other symptoms.  She reports she feels at her baseline.  She does report menstrual cycles that are quite heavy if but they only last 3 to 4 days.  It looks like she is chronically anemic and has trended down over the last several years.  I discussed that she certainly needs a work-up for this and will likely need to see a specialist to help determine the cause of her anemia.  I discussed that her primary care physician would follow-up on all of this for her.  The patient was advised to start on an iron supplement 1 tablet once daily. She was also advised to seek medical attention in the ED if she developed shortness of breath, chest pain, excessive light headedness, or any new symptoms.  Message sent to the patient's PCP who is aware of the findings.  I will send this phone message to her PCP as well.  She would certainly benefit from a GI work-up including endoscopy and colonoscopy given her iron deficiency to evaluate for source and given her heavy menstrual cycle she likely needs to see GYN as well.

## 2021-07-28 NOTE — Addendum Note (Signed)
Addended by: Debbrah Alar on: 07/28/2021 10:09 AM   Modules accepted: Orders

## 2021-07-28 NOTE — Telephone Encounter (Signed)
Please advised pt that I also reviewed her low iron/blood count. I know that covering MD also spoke with her. I would like for her to complete and return IFOB asap and also to start the iron one tab once daily. I am placing referrals to GI and the GYN.  Also, I would like to see her back in 1 month please.

## 2021-07-29 NOTE — Telephone Encounter (Signed)
Patient advised of results, to start otc iron 325 mg daily, she will bring ifob back today and schedule one month follow up while she is here. She agrees with referral.

## 2021-07-30 LAB — HGB FRACTIONATION CASCADE
Hgb A2: 1.9 % (ref 1.8–3.2)
Hgb A: 98.1 % (ref 96.4–98.8)
Hgb F: 0 % (ref 0.0–2.0)
Hgb S: 0 %

## 2021-07-30 LAB — IRON AND TIBC
Iron Saturation: 2 % — CL (ref 15–55)
Iron: 12 ug/dL — ABNORMAL LOW (ref 27–159)
Total Iron Binding Capacity: 491 ug/dL — ABNORMAL HIGH (ref 250–450)
UIBC: 479 ug/dL — ABNORMAL HIGH (ref 131–425)

## 2021-07-31 ENCOUNTER — Encounter: Payer: Self-pay | Admitting: Gastroenterology

## 2021-07-31 ENCOUNTER — Other Ambulatory Visit (INDEPENDENT_AMBULATORY_CARE_PROVIDER_SITE_OTHER): Payer: 59

## 2021-07-31 DIAGNOSIS — D649 Anemia, unspecified: Secondary | ICD-10-CM | POA: Diagnosis not present

## 2021-07-31 LAB — FECAL OCCULT BLOOD, IMMUNOCHEMICAL: Fecal Occult Bld: NEGATIVE

## 2021-08-01 ENCOUNTER — Telehealth: Payer: Self-pay | Admitting: Family

## 2021-08-01 DIAGNOSIS — D509 Iron deficiency anemia, unspecified: Secondary | ICD-10-CM

## 2021-08-01 NOTE — Telephone Encounter (Signed)
See mychart.  

## 2021-08-05 ENCOUNTER — Telehealth: Payer: Self-pay | Admitting: *Deleted

## 2021-08-05 NOTE — Telephone Encounter (Signed)
Per referral - called and gave upcoming appointments - confirmed.

## 2021-08-06 ENCOUNTER — Encounter: Payer: Self-pay | Admitting: Family

## 2021-08-06 ENCOUNTER — Other Ambulatory Visit: Payer: Self-pay | Admitting: Family

## 2021-08-06 ENCOUNTER — Other Ambulatory Visit: Payer: Self-pay

## 2021-08-06 ENCOUNTER — Inpatient Hospital Stay: Payer: 59 | Attending: Hematology & Oncology

## 2021-08-06 ENCOUNTER — Inpatient Hospital Stay (HOSPITAL_BASED_OUTPATIENT_CLINIC_OR_DEPARTMENT_OTHER): Payer: 59 | Admitting: Family

## 2021-08-06 VITALS — BP 111/72 | HR 80 | Temp 98.2°F | Resp 17 | Wt 123.8 lb

## 2021-08-06 DIAGNOSIS — D500 Iron deficiency anemia secondary to blood loss (chronic): Secondary | ICD-10-CM

## 2021-08-06 DIAGNOSIS — N920 Excessive and frequent menstruation with regular cycle: Secondary | ICD-10-CM | POA: Diagnosis present

## 2021-08-06 LAB — CBC WITH DIFFERENTIAL (CANCER CENTER ONLY)
Abs Immature Granulocytes: 0.1 10*3/uL — ABNORMAL HIGH (ref 0.00–0.07)
Basophils Absolute: 0.1 10*3/uL (ref 0.0–0.1)
Basophils Relative: 1 %
Eosinophils Absolute: 0.5 10*3/uL (ref 0.0–0.5)
Eosinophils Relative: 6 %
HCT: 31 % — ABNORMAL LOW (ref 36.0–46.0)
Hemoglobin: 8.8 g/dL — ABNORMAL LOW (ref 12.0–15.0)
Immature Granulocytes: 1 %
Lymphocytes Relative: 30 %
Lymphs Abs: 2.6 10*3/uL (ref 0.7–4.0)
MCH: 18.4 pg — ABNORMAL LOW (ref 26.0–34.0)
MCHC: 28.4 g/dL — ABNORMAL LOW (ref 30.0–36.0)
MCV: 65 fL — ABNORMAL LOW (ref 80.0–100.0)
Monocytes Absolute: 0.5 10*3/uL (ref 0.1–1.0)
Monocytes Relative: 5 %
Neutro Abs: 5.1 10*3/uL (ref 1.7–7.7)
Neutrophils Relative %: 57 %
Platelet Count: 353 10*3/uL (ref 150–400)
RBC: 4.77 MIL/uL (ref 3.87–5.11)
RDW: 25.2 % — ABNORMAL HIGH (ref 11.5–15.5)
WBC Count: 8.9 10*3/uL (ref 4.0–10.5)
nRBC: 0 % (ref 0.0–0.2)

## 2021-08-06 LAB — RETICULOCYTES
Immature Retic Fract: 28.8 % — ABNORMAL HIGH (ref 2.3–15.9)
RBC.: 4.79 MIL/uL (ref 3.87–5.11)
Retic Count, Absolute: 141.8 10*3/uL (ref 19.0–186.0)
Retic Ct Pct: 3 % (ref 0.4–3.1)

## 2021-08-07 ENCOUNTER — Telehealth: Payer: Self-pay | Admitting: *Deleted

## 2021-08-07 LAB — FERRITIN: Ferritin: 82 ng/mL (ref 11–307)

## 2021-08-07 LAB — IRON AND TIBC
Iron: 41 ug/dL (ref 28–170)
Saturation Ratios: 7 % — ABNORMAL LOW (ref 10.4–31.8)
TIBC: 553 ug/dL — ABNORMAL HIGH (ref 250–450)
UIBC: 512 ug/dL

## 2021-08-07 NOTE — Progress Notes (Signed)
Hematology and Oncology Follow Up Visit  Brittany Banks [id number] April 08, 1976 45 y.o. 08/07/2021   Principle Diagnosis:  Iron deficiency anemia secondary to heavy cycle  Current Therapy:   IV iron as indicated    Interim History:  Brittany Banks is here today to re-establish care for iron deficiency anemia.  Her cycle is still quite heavy for 4 days out of 5-6 days. She does note clots. She states that she has been referred to gynecology for further eval and treatment.  No other bleeding noted. No abnormal bruising, no petechiae.  She states that she had recent hemoccult test done and result was negative. She states that she has been referred to GI as well.  Hgb is 8.8, MCV 65, WBC count 8.9 and platelets 353.  Iron saturation 2 weeks ago was 2% and ferritin 2.7. She denies ever having received IV iron in the past.  She has started an oral iron supplement but this does cause her GI upset with diarrhea.  She denies fatigue.  No fever, chills, n/v, cough, rash, dizziness, SOB, chest pain, palpitations, abdominal pain or changes in bowel or bladder habits.  No swelling, tenderness, numbness or tingling in her extremities at this time.  No falls or syncope.  She has maintained a good appetite and is staying well hydrated. Her weight is stable at 123 lbs.    ECOG Performance Status: 1 - Symptomatic but completely ambulatory  Medications:  Allergies as of 08/06/2021   No Known Allergies      Medication List        Accurate as of August 06, 2021 11:59 PM. If you have any questions, ask your nurse or doctor.          One-A-Day Womens Formula Tabs Take 1 tablet by mouth daily.        Allergies: No Known Allergies  Past Medical History, Surgical history, Social history, and Family History were reviewed and updated.  Review of Systems: All other 10 point review of systems is negative.   Physical Exam:  weight is 123 lb 12.8 oz (56.2 kg). Her oral temperature is 98.2 F  (36.8 C). Her blood pressure is 111/72 and her pulse is 80. Her respiration is 17 and oxygen saturation is 100%.   Wt Readings from Last 3 Encounters:  08/06/21 123 lb 12.8 oz (56.2 kg)  07/26/21 123 lb (55.8 kg)  07/24/20 133 lb (60.3 kg)    Ocular: Sclerae unicteric, pupils equal, round and reactive to light Ear-nose-throat: Oropharynx clear, dentition fair Lymphatic: No cervical or supraclavicular adenopathy Lungs no rales or rhonchi, good excursion bilaterally Heart regular rate and rhythm, no murmur appreciated Abd soft, nontender, positive bowel sounds MSK no focal spinal tenderness, no joint edema Neuro: non-focal, well-oriented, appropriate affect Breasts: Deferred   Lab Results  Component Value Date   WBC 8.9 08/06/2021   HGB 8.8 (L) 08/06/2021   HCT 31.0 (L) 08/06/2021   MCV 65.0 (L) 08/06/2021   PLT 353 08/06/2021   Lab Results  Component Value Date   FERRITIN 2.7 (L) 07/26/2021   IRON 7 (L) 07/26/2021   IRON 12 (L) 07/26/2021   TIBC 491 (H) 07/26/2021   UIBC 479 (H) 07/26/2021   IRONPCTSAT 2 (LL) 07/26/2021   Lab Results  Component Value Date   RETICCTPCT 3.0 08/06/2021   RBC 4.79 08/06/2021   No results found for: KPAFRELGTCHN, LAMBDASER, KAPLAMBRATIO No results found for: IGGSERUM, IGA, IGMSERUM No results found for: TOTALPROTELP, ALBUMINELP, A1GS, A2GS, BETS, BETA2SER,  Danise Edge, SPEI   Chemistry      Component Value Date/Time   NA 138 07/26/2021 1054   K 4.1 07/26/2021 1054   CL 105 07/26/2021 1054   CO2 25 07/26/2021 1054   BUN 13 07/26/2021 1054   CREATININE 0.58 07/26/2021 1054   CREATININE 0.60 01/21/2018 1428      Component Value Date/Time   CALCIUM 9.2 07/26/2021 1054   ALKPHOS 75 07/26/2021 1054   AST 13 07/26/2021 1054   AST 22 01/21/2018 1428   ALT 14 07/26/2021 1054   ALT 19 01/21/2018 1428   BILITOT 0.7 07/26/2021 1054   BILITOT 0.6 01/21/2018 1428       Impression and Plan: Brittany Banks is a very pleasant 45 yo female with  iron deficiency anemia secondary to heavy cycles.  Iron studies are pending. She is agreeable to receiving IV iron if needed.  We will plan to see her again in another 8 weeks.  She can contact our office with any questions or concerns.   [name], NP 10/12/20228:27 AM

## 2021-08-07 NOTE — Telephone Encounter (Signed)
Per 08/06/21 los - called and gave upcoming appointments - confirmed

## 2021-08-08 ENCOUNTER — Telehealth: Payer: Self-pay | Admitting: *Deleted

## 2021-08-08 ENCOUNTER — Other Ambulatory Visit: Payer: Self-pay | Admitting: Family

## 2021-08-08 NOTE — Telephone Encounter (Signed)
Per scheduling message Brittany Banks - called and gave upcoming appointments - confirmed (2) doses of IV Iron ?

## 2021-08-14 ENCOUNTER — Ambulatory Visit: Payer: 59 | Admitting: Gastroenterology

## 2021-08-15 ENCOUNTER — Other Ambulatory Visit: Payer: Self-pay

## 2021-08-15 ENCOUNTER — Inpatient Hospital Stay: Payer: 59

## 2021-08-15 VITALS — BP 112/64 | HR 60 | Temp 97.7°F | Resp 18

## 2021-08-15 DIAGNOSIS — D500 Iron deficiency anemia secondary to blood loss (chronic): Secondary | ICD-10-CM | POA: Diagnosis not present

## 2021-08-15 MED ORDER — ALTEPLASE 2 MG IJ SOLR: 2.0000 mg | Freq: Once | INTRAMUSCULAR | Status: DC | PRN

## 2021-08-15 MED ORDER — HEPARIN SOD (PORK) LOCK FLUSH 100 UNIT/ML IV SOLN: 500.0000 [IU] | Freq: Once | INTRAVENOUS | Status: DC | PRN

## 2021-08-15 MED ORDER — DIPHENHYDRAMINE HCL 50 MG/ML IJ SOLN
50.0000 mg | Freq: Once | INTRAMUSCULAR | Status: AC | PRN
  Administered 2021-08-15: 25 mg via INTRAVENOUS

## 2021-08-15 MED ORDER — SODIUM CHLORIDE 0.9% FLUSH: 10.0000 mL | Freq: Once | INTRAVENOUS | Status: DC | PRN

## 2021-08-15 MED ORDER — SODIUM CHLORIDE 0.9 % IV SOLN
510.0000 mg | Freq: Once | INTRAVENOUS | Status: AC
  Administered 2021-08-15: 510 mg via INTRAVENOUS
  Filled 2021-08-15: qty 510

## 2021-08-15 MED ORDER — METHYLPREDNISOLONE SODIUM SUCC 125 MG IJ SOLR
125.0000 mg | Freq: Once | INTRAMUSCULAR | Status: AC | PRN
  Administered 2021-08-15: 125 mg via INTRAVENOUS

## 2021-08-15 MED ORDER — SODIUM CHLORIDE 0.9 % IV SOLN
Freq: Once | INTRAVENOUS | Status: AC
Start: 2021-08-15 — End: 2021-08-15

## 2021-08-15 MED ORDER — HEPARIN SOD (PORK) LOCK FLUSH 100 UNIT/ML IV SOLN: 250.0000 [IU] | Freq: Once | INTRAVENOUS | Status: DC | PRN

## 2021-08-15 MED ORDER — SODIUM CHLORIDE 0.9% FLUSH: 3.0000 mL | Freq: Once | INTRAVENOUS | Status: DC | PRN

## 2021-08-15 NOTE — Progress Notes (Signed)
1508 Patient had her hand to her chest stating that she felt heaviness and tightness. Said she didn't feel well and her "head felt funny"  IV Feraheme stopped.  Bag of .9 NS added to IV.  Solumedrol 125 mg given, Benadryl 25 mg given.  Patient states symptoms have subsided.

## 2021-08-15 NOTE — Patient Instructions (Signed)
Ferumoxytol Injection What is this medication? FERUMOXYTOL (FER ue MOX i tol) treats low levels of iron in your body (iron deficiency anemia). Iron is a mineral that plays an important role in making red blood cells, which carry oxygen from your lungs to the rest of your body. This medicine may be used for other purposes; ask your health care provider or pharmacist if you have questions. COMMON BRAND NAME(S): Feraheme What should I tell my care team before I take this medication? They need to know if you have any of these conditions: Anemia not caused by low iron levels High levels of iron in the blood Magnetic resonance imaging (MRI) test scheduled An unusual or allergic reaction to iron, other medications, foods, dyes, or preservatives Pregnant or trying to get pregnant Breast-feeding How should I use this medication? This medication is for injection into a vein. It is given in a hospital or clinic setting. Talk to your care team the use of this medication in children. Special care may be needed. Overdosage: If you think you have taken too much of this medicine contact a poison control center or emergency room at once. NOTE: This medicine is only for you. Do not share this medicine with others. What if I miss a dose? It is important not to miss your dose. Call your care team if you are unable to keep an appointment. What may interact with this medication? Other iron products This list may not describe all possible interactions. Give your health care provider a list of all the medicines, herbs, non-prescription drugs, or dietary supplements you use. Also tell them if you smoke, drink alcohol, or use illegal drugs. Some items may interact with your medicine. What should I watch for while using this medication? Visit your care team regularly. Tell your care team if your symptoms do not start to get better or if they get worse. You may need blood work done while you are taking this  medication. You may need to follow a special diet. Talk to your care team. Foods that contain iron include: whole grains/cereals, dried fruits, beans, or peas, leafy green vegetables, and organ meats (liver, kidney). What side effects may I notice from receiving this medication? Side effects that you should report to your care team as soon as possible: Allergic reactions-skin rash, itching, hives, swelling of the face, lips, tongue, or throat Low blood pressure-dizziness, feeling faint or lightheaded, blurry vision Shortness of breath Side effects that usually do not require medical attention (report to your care team if they continue or are bothersome): Flushing Headache Joint pain Muscle pain Nausea Pain, redness, or irritation at injection site This list may not describe all possible side effects. Call your doctor for medical advice about side effects. You may report side effects to FDA at 1-800-FDA-1088. Where should I keep my medication? This medication is given in a hospital or clinic and will not be stored at home. NOTE: This sheet is a summary. It may not cover all possible information. If you have questions about this medicine, talk to your doctor, pharmacist, or health care provider.  2022 Elsevier/Gold Standard (2021-03-01 15:35:12)  

## 2021-08-16 ENCOUNTER — Other Ambulatory Visit: Payer: Self-pay | Admitting: Family

## 2021-08-22 ENCOUNTER — Other Ambulatory Visit: Payer: Self-pay

## 2021-08-22 ENCOUNTER — Inpatient Hospital Stay: Payer: 59

## 2021-08-22 VITALS — BP 102/69 | HR 81 | Temp 98.4°F | Resp 17

## 2021-08-22 DIAGNOSIS — D500 Iron deficiency anemia secondary to blood loss (chronic): Secondary | ICD-10-CM

## 2021-08-22 MED ORDER — FAMOTIDINE 20 MG IN NS 100 ML IVPB
20.0000 mg | Freq: Once | INTRAVENOUS | Status: AC
  Administered 2021-08-22: 20 mg via INTRAVENOUS
  Filled 2021-08-22: qty 20

## 2021-08-22 MED ORDER — METHYLPREDNISOLONE SODIUM SUCC 125 MG IJ SOLR
INTRAMUSCULAR | Status: AC
  Filled 2021-08-22: qty 2

## 2021-08-22 MED ORDER — FAMOTIDINE IN NACL 20-0.9 MG/50ML-% IV SOLN: 20.0000 mg | Freq: Once | INTRAVENOUS | Status: DC

## 2021-08-22 MED ORDER — METHYLPREDNISOLONE SODIUM SUCC 125 MG IJ SOLR
60.0000 mg | Freq: Once | INTRAMUSCULAR | Status: AC
  Administered 2021-08-22: 60 mg via INTRAVENOUS

## 2021-08-22 MED ORDER — SODIUM CHLORIDE 0.9 % IV SOLN: Freq: Once | INTRAVENOUS | Status: AC

## 2021-08-22 MED ORDER — DIPHENHYDRAMINE HCL 25 MG PO CAPS
25.0000 mg | ORAL_CAPSULE | Freq: Once | ORAL | Status: AC
  Administered 2021-08-22: 25 mg via ORAL
  Filled 2021-08-22: qty 1

## 2021-08-22 MED ORDER — SODIUM CHLORIDE 0.9 % IV SOLN
510.0000 mg | Freq: Once | INTRAVENOUS | Status: AC
  Administered 2021-08-22: 510 mg via INTRAVENOUS
  Filled 2021-08-22: qty 17

## 2021-08-22 NOTE — Patient Instructions (Signed)
[facility]  Discharge Instructions: Thank you for choosing [facility] to provide your oncology and hematology care.   If you have a lab appointment with the Cancer Center, please go directly to the Cancer Center and check in at the registration area.  Wear comfortable clothing and clothing appropriate for easy access to any Portacath or PICC line.   We strive to give you quality time with your provider. You may need to reschedule your appointment if you arrive late (15 or more minutes).  Arriving late affects you and other patients whose appointments are after yours.  Also, if you miss three or more appointments without notifying the office, you may be dismissed from the clinic at the provider's discretion.      For prescription refill requests, have your pharmacy contact our office and allow 72 hours for refills to be completed.    Today you received the following chemotherapy and/or immunotherapy agents feraheme     To help prevent nausea and vomiting after your treatment, we encourage you to take your nausea medication as directed.  BELOW ARE SYMPTOMS THAT SHOULD BE REPORTED IMMEDIATELY: *FEVER GREATER THAN 100.4 F (38 C) OR HIGHER *CHILLS OR SWEATING *NAUSEA AND VOMITING THAT IS NOT CONTROLLED WITH YOUR NAUSEA MEDICATION *UNUSUAL SHORTNESS OF BREATH *UNUSUAL BRUISING OR BLEEDING *URINARY PROBLEMS (pain or burning when urinating, or frequent urination) *BOWEL PROBLEMS (unusual diarrhea, constipation, pain near the anus) TENDERNESS IN MOUTH AND THROAT WITH OR WITHOUT PRESENCE OF ULCERS (sore throat, sores in mouth, or a toothache) UNUSUAL RASH, SWELLING OR PAIN  UNUSUAL VAGINAL DISCHARGE OR ITCHING   Items with * indicate a potential emergency and should be followed up as soon as possible or go to the Emergency Department if any problems should occur.  Please show the CHEMOTHERAPY ALERT CARD or IMMUNOTHERAPY ALERT CARD at check-in to the  Emergency Department and triage nurse. Should you have questions after your visit or need to cancel or reschedule your appointment, please contact [facility]  [phone] and follow the prompts.  Office hours are 8:00 a.m. to 4:30 p.m. Monday - Friday. Please note that voicemails left after 4:00 p.m. may not be returned until the following business day.  We are closed weekends and major holidays. You have access to a nurse at all times for urgent questions. Please call the main number to the clinic [phone] and follow the prompts.  For any non-urgent questions, you may also contact your provider using MyChart. We now offer e-Visits for anyone 18 and older to request care online for non-urgent symptoms. For details visit [email or web address].   Also download the MyChart app! Go to the app store, search "MyChart", open the app, select , and log in with your MyChart username and password.  Due to Covid, a mask is required upon entering the hospital/clinic. If you do not have a mask, one will be given to you upon arrival. For doctor visits, patients may have 1 support person aged 18 or older with them. For treatment visits, patients cannot have anyone with them due to current Covid guidelines and our immunocompromised population.  

## 2021-09-03 ENCOUNTER — Ambulatory Visit (HOSPITAL_BASED_OUTPATIENT_CLINIC_OR_DEPARTMENT_OTHER)
Admission: RE | Admit: 2021-09-03 | Discharge: 2021-09-03 | Disposition: A | Discharge status: Home / Self Care | Payer: 59 | Source: Ambulatory Visit | Attending: Family | Admitting: Family

## 2021-09-03 ENCOUNTER — Encounter (HOSPITAL_BASED_OUTPATIENT_CLINIC_OR_DEPARTMENT_OTHER): Payer: Self-pay

## 2021-09-03 ENCOUNTER — Other Ambulatory Visit: Payer: Self-pay

## 2021-09-03 DIAGNOSIS — M8580 Other specified disorders of bone density and structure, unspecified site: Secondary | ICD-10-CM | POA: Insufficient documentation

## 2021-09-03 DIAGNOSIS — Z0000 Encounter for general adult medical examination without abnormal findings: Secondary | ICD-10-CM | POA: Diagnosis present

## 2021-09-26 NOTE — Progress Notes (Signed)
GYNECOLOGY OFFICE VISIT NOTE  History:   Brittany Banks is a 45 y.o. 2/2 here today for menorrhagia/heavy bleeding causing iron def anemia. She is quite anemia - running in 7-9 range.   Iron infusions due to anemia 2/2 c-sections Tubal ligation  Saw [facility] doc -- recommended hormonal or surgical options - she declined.  Last [place] was 2017 - normal per physician notes but report note available.    She would like to do something for the bleeding now but would like whatever is lowest side effects.   She has bleeding heavily changing her pads q1 hr and at night 3-4 times a night. Her bleeding is heavy for multiple days. Her cycles are regular so this happens monthly for her. She has not yet tried anything. She does not think she could remember pills.   She denies any abnormal vaginal discharge, pelvic pain or other concerns.    History reviewed. No pertinent past medical history.  Past Surgical History:  Procedure Laterality Date   BREAST BIOPSY Right 09/26/2020   Fibroadenoma   dental implant  Right    2022   TUBAL LIGATION  10/27/2004   WISDOM TOOTH EXTRACTION      The following portions of the patient's history were reviewed and updated as appropriate: allergies, current medications, past family history, past medical history, past social history, past surgical history and problem list.   Health Maintenance:   No results found for: DIAGPAP - no pap on file in care everywhere  Normal mammogram on 08/2021.   Review of Systems:  Pertinent items noted in HPI and remainder of comprehensive ROS otherwise negative.  Physical Exam:  BP 120/76   Pulse 68   Ht 5' (1.524 m)   Wt 125 lb (56.7 kg)   LMP 09/13/2021 (Exact Date)   BMI 24.41 kg/m  CONSTITUTIONAL: Well-developed, well-nourished female in no acute distress.  HEENT:  Normocephalic, atraumatic. External right and left ear normal. No scleral icterus.  NECK: Normal range of motion, supple, no masses noted on  observation SKIN: No rash noted. Not diaphoretic. No erythema. No pallor. MUSCULOSKELETAL: Normal range of motion. No edema noted. NEUROLOGIC: Alert and oriented to person, place, and time. Normal muscle tone coordination. No cranial nerve deficit noted. PSYCHIATRIC: Normal mood and affect. Normal behavior. Normal judgment and thought content.  CARDIOVASCULAR: Normal heart rate noted RESPIRATORY: Effort and breath sounds normal, no problems with respiration noted ABDOMEN: No masses noted. No other overt distention noted.    PELVIC: Normal appearing external genitalia; normal urethral meatus; normal appearing vaginal mucosa and cervix.  No abnormal discharge noted.  Normal uterine size, no other palpable masses, no uterine or adnexal tenderness. Performed in the presence of a chaperone  Labs and Imaging CBC    Component Value Date/Time   WBC 7.1 10/01/2021 1020   WBC 6.2 07/26/2021 1054   RBC 4.91 10/01/2021 1020   RBC 4.84 10/01/2021 1020   HGB 12.4 10/01/2021 1020   HCT 38.9 10/01/2021 1020   PLT 319 10/01/2021 1020   MCV 79.2 (L) 10/01/2021 1020   MCH 25.3 (L) 10/01/2021 1020   MCHC 31.9 10/01/2021 1020   RDW 24.8 (H) 10/01/2021 1020   LYMPHSABS 1.9 10/01/2021 1020   MONOABS 0.4 10/01/2021 1020   EOSABS 0.6 (H) 10/01/2021 1020   BASOSABS 0.1 10/01/2021 1020    GYNECOLOGY OFFICE PROCEDURE NOTE  Brittany Banks is a 45 y.o. [id number] here for IUD insertion. No GYN concerns.   IUD Insertion Procedure Note  Procedure: IUD insertion with Liletta UPT: Negative GC/CT testing: Offered and declines  Patient identified.  Risks, benefits and alternatives of procedure were discussed including irregular bleeding, cramping, infection, malpositioning or misplacement of the IUD outside the uterus which may require further procedure such as laparoscopy. Also discussed >99% contraception efficacy, increased risk of ectopic pregnancy with failure of method.   Emphasized that this did not protect  against STIs, condoms recommended during all sexual encounters. Consent signed. Time out performed.   Speculum inserted and cervix visualized, prepped with 3 swabs of betadine.   Grasped with a single tooth tenaculum. , Cervical dilators used. , and IUD then inserted without difficulty per manufacturer's instructions and strings cut to 3 cm below cervical os and all instruments removed. Pt tolerated well with minimal pain and bleeding.   Discussed concerning signs/symptoms and to call if heavy bleeding, severe abdominal pain, or fever in the following 3 weeks. Manufacturer pamphlet/patient information given. Reviewed timing of efficacy for contraception and to use an alternative form of birth control until that time.   Radene Gunning, MD, [place] for [facility]     Assessment and Plan:    1. Menorrhagia with regular cycle - Recommend evaluation with [place] since last done in 2017.  - Assuming no new anatomic pathology we reviewed the following options: expectant management, TXA, hormonal options I.e. birth control based on options, surgical options such as ablation, [place], and hysterectomy.  - Discussed the risks and benefits of each option.  - Given her persistently low HgB and need for infusions, I would advise against expectant management. She has been doing this option since at least 2019. Risk to expectant management includes but are not limited to blood transfusions, side effects of chronic anemia, need for emergent surgery.  - She would like IUD today (see procedure note for details) and TXA. She would like to do surgery as a last resort but also doesn't want options with a lot of side effects.   2. IUD insertion - Discussed the risk, benefits, and alternatives to an IUD.  Risks include but are not limited to: bleeding, infection, uterine perforation with misplacement of the IUD, expulsion (1%), changes to  bleeding patterns with some women experiencing amenorrhea.  - Consent reviewed and signed. See procedure note for details. No complications. Discussed precautions and concerning signs/symptoms afterward - i.e. symptoms of expulsion, fever, etc.  - Reminder card given and information pamphlet given.   Routine preventative health maintenance measures emphasized. Please refer to After Visit Summary for other counseling recommendations.   Return in about 1 month (around 11/02/2021) for string check and pelvic ultrasound.   Radene Gunning, MD, [place] for [facility], [place]

## 2021-10-01 ENCOUNTER — Inpatient Hospital Stay: Payer: 59 | Attending: Hematology & Oncology

## 2021-10-01 ENCOUNTER — Inpatient Hospital Stay: Payer: 59 | Admitting: Family

## 2021-10-01 ENCOUNTER — Other Ambulatory Visit: Payer: Self-pay

## 2021-10-01 ENCOUNTER — Inpatient Hospital Stay: Payer: 59

## 2021-10-01 DIAGNOSIS — D500 Iron deficiency anemia secondary to blood loss (chronic): Secondary | ICD-10-CM

## 2021-10-01 DIAGNOSIS — D509 Iron deficiency anemia, unspecified: Secondary | ICD-10-CM | POA: Insufficient documentation

## 2021-10-01 DIAGNOSIS — N920 Excessive and frequent menstruation with regular cycle: Secondary | ICD-10-CM | POA: Diagnosis present

## 2021-10-01 LAB — CBC WITH DIFFERENTIAL (CANCER CENTER ONLY)
Abs Immature Granulocytes: 0.01 10*3/uL (ref 0.00–0.07)
Basophils Absolute: 0.1 10*3/uL (ref 0.0–0.1)
Basophils Relative: 1 %
Eosinophils Absolute: 0.6 10*3/uL — ABNORMAL HIGH (ref 0.0–0.5)
Eosinophils Relative: 8 %
HCT: 38.9 % (ref 36.0–46.0)
Hemoglobin: 12.4 g/dL (ref 12.0–15.0)
Immature Granulocytes: 0 %
Lymphocytes Relative: 27 %
Lymphs Abs: 1.9 10*3/uL (ref 0.7–4.0)
MCH: 25.3 pg — ABNORMAL LOW (ref 26.0–34.0)
MCHC: 31.9 g/dL (ref 30.0–36.0)
MCV: 79.2 fL — ABNORMAL LOW (ref 80.0–100.0)
Monocytes Absolute: 0.4 10*3/uL (ref 0.1–1.0)
Monocytes Relative: 6 %
Neutro Abs: 4.1 10*3/uL (ref 1.7–7.7)
Neutrophils Relative %: 58 %
Platelet Count: 319 10*3/uL (ref 150–400)
RBC: 4.91 MIL/uL (ref 3.87–5.11)
RDW: 24.8 % — ABNORMAL HIGH (ref 11.5–15.5)
WBC Count: 7.1 10*3/uL (ref 4.0–10.5)
nRBC: 0 % (ref 0.0–0.2)

## 2021-10-01 LAB — IRON AND TIBC
Iron: 101 ug/dL (ref 41–142)
Saturation Ratios: 37 % (ref 21–57)
TIBC: 275 ug/dL (ref 236–444)
UIBC: 174 ug/dL (ref 120–384)

## 2021-10-01 LAB — RETICULOCYTES
Immature Retic Fract: 2.8 % (ref 2.3–15.9)
RBC.: 4.84 MIL/uL (ref 3.87–5.11)
Retic Count, Absolute: 43.6 10*3/uL (ref 19.0–186.0)
Retic Ct Pct: 0.9 % (ref 0.4–3.1)

## 2021-10-01 LAB — SAMPLE TO BLOOD BANK

## 2021-10-01 LAB — FERRITIN: Ferritin: 210 ng/mL (ref 11–307)

## 2021-10-02 ENCOUNTER — Inpatient Hospital Stay (HOSPITAL_BASED_OUTPATIENT_CLINIC_OR_DEPARTMENT_OTHER): Payer: 59 | Admitting: Family

## 2021-10-02 ENCOUNTER — Encounter: Payer: Self-pay | Admitting: General Practice

## 2021-10-02 ENCOUNTER — Other Ambulatory Visit (HOSPITAL_COMMUNITY)
Admission: RE | Admit: 2021-10-02 | Discharge: 2021-10-02 | Disposition: A | Discharge status: Home / Self Care | Payer: 59 | Source: Ambulatory Visit | Attending: Obstetrics and Gynecology | Admitting: Obstetrics and Gynecology

## 2021-10-02 ENCOUNTER — Encounter: Payer: Self-pay | Admitting: Obstetrics and Gynecology

## 2021-10-02 ENCOUNTER — Telehealth: Payer: Self-pay | Admitting: General Practice

## 2021-10-02 ENCOUNTER — Inpatient Hospital Stay: Payer: 59

## 2021-10-02 ENCOUNTER — Ambulatory Visit (INDEPENDENT_AMBULATORY_CARE_PROVIDER_SITE_OTHER): Payer: 59 | Admitting: Obstetrics and Gynecology

## 2021-10-02 ENCOUNTER — Encounter: Payer: Self-pay | Admitting: Family

## 2021-10-02 VITALS — BP 120/76 | HR 68 | Ht 60.0 in | Wt 125.0 lb

## 2021-10-02 VITALS — BP 123/74 | HR 72 | Temp 97.9°F | Resp 17 | Wt 125.0 lb

## 2021-10-02 DIAGNOSIS — Z30430 Encounter for insertion of intrauterine contraceptive device: Secondary | ICD-10-CM | POA: Diagnosis not present

## 2021-10-02 DIAGNOSIS — D500 Iron deficiency anemia secondary to blood loss (chronic): Secondary | ICD-10-CM

## 2021-10-02 DIAGNOSIS — N920 Excessive and frequent menstruation with regular cycle: Secondary | ICD-10-CM | POA: Diagnosis present

## 2021-10-02 MED ORDER — TRANEXAMIC ACID 650 MG PO TABS
1300.0000 mg | ORAL_TABLET | Freq: Three times a day (TID) | ORAL | 2 refills | Status: DC
Start: 2021-10-02 — End: 2021-11-25

## 2021-10-02 MED ORDER — LEVONORGESTREL 20.1 MCG/DAY IU IUD
1.0000 | INTRAUTERINE_SYSTEM | Freq: Once | INTRAUTERINE | Status: AC
  Administered 2021-10-02: 1 via INTRAUTERINE

## 2021-10-02 NOTE — Telephone Encounter (Signed)
Patient aware of insurance [facility]) will not be in network with [place] effective 10/27/2021.  Information given to patient of when open enrollment will be and choosing a new plan.  Pt verbalized understanding.

## 2021-10-02 NOTE — Progress Notes (Signed)
Hematology and Oncology Follow Up Visit  Brittany Banks [id number] 12-18-1975 45 y.o. 10/02/2021   Principle Diagnosis:  Iron deficiency anemia secondary to heavy cycle   Current Therapy:        IV iron as indicated                Interim History:  Brittany Banks is here today for follow-up. She received 2 doses of IV iron and is feeling much better.  Her fatigue and SOB has resolved.  She does not crave ice. She had an IUD placed by her gynecologist this morning.  No blood loss noted. No bruising or petechiae.  No fever, chills, n/v, cough, rash, dizziness, chest pain, palpitations, abdominal pain or changes in bowel or bladder habits.  She has occasional GERD with certain foods.  No swelling, tenderness, numbness or tingling in her extremities.  No falls or syncope.  She has been eating well and staying well hydrated. Her weight is stable at 125 lbs.   ECOG Performance Status: 0 - Asymptomatic  Medications:  Allergies as of 10/02/2021   No Known Allergies      Medication List        Accurate as of October 02, 2021  1:15 PM. If you have any questions, ask your nurse or doctor.          One-A-Day Womens Formula Tabs Take 1 tablet by mouth daily.   tranexamic acid 650 MG Tabs tablet Commonly known as: LYSTEDA Take 2 tablets (1,300 mg total) by mouth 3 (three) times daily. Take during menses for a maximum of five days Started by: Radene Gunning, MD        Allergies: No Known Allergies  Past Medical History, Surgical history, Social history, and Family History were reviewed and updated.  Review of Systems: All other 10 point review of systems is negative.   Physical Exam:  vitals were not taken for this visit.   Wt Readings from Last 3 Encounters:  10/02/21 125 lb (56.7 kg)  08/06/21 123 lb 12.8 oz (56.2 kg)  07/26/21 123 lb (55.8 kg)    Ocular: Sclerae unicteric, pupils equal, round and reactive to light Ear-nose-throat: Oropharynx clear, dentition  fair Lymphatic: No cervical or supraclavicular adenopathy Lungs no rales or rhonchi, good excursion bilaterally Heart regular rate and rhythm, no murmur appreciated Abd soft, nontender, positive bowel sounds MSK no focal spinal tenderness, no joint edema Neuro: non-focal, well-oriented, appropriate affect Breasts: Deferred   Lab Results  Component Value Date   WBC 7.1 10/01/2021   HGB 12.4 10/01/2021   HCT 38.9 10/01/2021   MCV 79.2 (L) 10/01/2021   PLT 319 10/01/2021   Lab Results  Component Value Date   FERRITIN 210 10/01/2021   IRON 101 10/01/2021   TIBC 275 10/01/2021   UIBC 174 10/01/2021   IRONPCTSAT 37 10/01/2021   Lab Results  Component Value Date   RETICCTPCT 0.9 10/01/2021   RBC 4.91 10/01/2021   RBC 4.84 10/01/2021   No results found for: KPAFRELGTCHN, LAMBDASER, KAPLAMBRATIO No results found for: IGGSERUM, IGA, IGMSERUM No results found for: Odetta Pink, SPEI   Chemistry      Component Value Date/Time   NA 138 07/26/2021 1054   K 4.1 07/26/2021 1054   CL 105 07/26/2021 1054   CO2 25 07/26/2021 1054   BUN 13 07/26/2021 1054   CREATININE 0.58 07/26/2021 1054   CREATININE 0.60 01/21/2018 1428      Component Value  Date/Time   CALCIUM 9.2 07/26/2021 1054   ALKPHOS 75 07/26/2021 1054   AST 13 07/26/2021 1054   AST 22 01/21/2018 1428   ALT 14 07/26/2021 1054   ALT 19 01/21/2018 1428   BILITOT 0.7 07/26/2021 1054   BILITOT 0.6 01/21/2018 1428       Impression and Plan: Brittany Banks is a very pleasant 45 yo female with iron deficiency anemia secondary to heavy cycles.  Iron studies are much improved. No infusion needed at this time.  Follow-up in 3 months.    [name], NP 12/7/20221:15 PM

## 2021-10-03 LAB — CYTOLOGY - PAP
Comment: NEGATIVE
Diagnosis: NEGATIVE
High risk HPV: NEGATIVE

## 2021-10-24 ENCOUNTER — Encounter: Payer: Self-pay | Admitting: Family

## 2021-11-25 ENCOUNTER — Ambulatory Visit: Payer: 59 | Admitting: Obstetrics and Gynecology

## 2021-11-25 ENCOUNTER — Encounter: Payer: Self-pay | Admitting: Family

## 2021-11-25 ENCOUNTER — Other Ambulatory Visit: Payer: Self-pay

## 2021-11-25 ENCOUNTER — Telehealth: Payer: Self-pay | Admitting: *Deleted

## 2021-11-25 ENCOUNTER — Ambulatory Visit (HOSPITAL_BASED_OUTPATIENT_CLINIC_OR_DEPARTMENT_OTHER)
Admission: RE | Admit: 2021-11-25 | Discharge: 2021-11-25 | Disposition: A | Discharge status: Home / Self Care | Payer: 59 | Source: Ambulatory Visit | Attending: Obstetrics and Gynecology | Admitting: Obstetrics and Gynecology

## 2021-11-25 ENCOUNTER — Encounter: Payer: Self-pay | Admitting: Obstetrics and Gynecology

## 2021-11-25 VITALS — BP 110/76 | HR 81 | Wt 126.0 lb

## 2021-11-25 DIAGNOSIS — D259 Leiomyoma of uterus, unspecified: Secondary | ICD-10-CM | POA: Diagnosis not present

## 2021-11-25 DIAGNOSIS — Z30433 Encounter for removal and reinsertion of intrauterine contraceptive device: Secondary | ICD-10-CM

## 2021-11-25 DIAGNOSIS — Z30431 Encounter for routine checking of intrauterine contraceptive device: Secondary | ICD-10-CM | POA: Diagnosis not present

## 2021-11-25 DIAGNOSIS — T8332XA Displacement of intrauterine contraceptive device, initial encounter: Secondary | ICD-10-CM

## 2021-11-25 DIAGNOSIS — N920 Excessive and frequent menstruation with regular cycle: Secondary | ICD-10-CM | POA: Insufficient documentation

## 2021-11-25 MED ORDER — LEVONORGESTREL 20.1 MCG/DAY IU IUD
1.0000 | INTRAUTERINE_SYSTEM | Freq: Once | INTRAUTERINE | Status: AC
  Administered 2021-11-25: 1 via INTRAUTERINE

## 2021-11-25 MED ORDER — TRANEXAMIC ACID 650 MG PO TABS: 1300.0000 mg | ORAL_TABLET | Freq: Three times a day (TID) | ORAL | 2 refills | Status: AC

## 2021-11-25 NOTE — Telephone Encounter (Signed)
Received a phone call from radiology stat results of pelvic US. Report IUD is not seen  MRI may be helpful to further evaluate endometrium which is not well evaluated and for definitive characterization to guide management of uterine mass.  Per chart is [facility] patient, sent message to Dr. Damita Dunnings to notify her and she verified she got my message and will read report , patient still in the office. Mikaila Grunert,RN

## 2021-11-25 NOTE — Addendum Note (Signed)
Addended by: Phill Myron on: 11/25/2021 11:13 AM   Modules accepted: Orders

## 2021-11-25 NOTE — Progress Notes (Addendum)
Patient presents for string check.patient states she feels strings some and it hurts at times. Kathrene Alu RN

## 2021-11-25 NOTE — Progress Notes (Signed)
GYNECOLOGY OFFICE VISIT NOTE  History:   Brittany Banks is a 46 y.o. [id number] here today for IUD string check and follow up from her [place] (she had this today).    Since having the IUD placed, her bleeding has improved. She does feel like sometimes she feels the strings and they pinch.   She denies any abnormal vaginal discharge, bleeding, pelvic pain or other concerns.   No past medical history on file.  Past Surgical History:  Procedure Laterality Date   BREAST BIOPSY Right 09/26/2020   Fibroadenoma   dental implant  Right    2022   TUBAL LIGATION  10/27/2004   WISDOM TOOTH EXTRACTION      The following portions of the patient's history were reviewed and updated as appropriate: allergies, current medications, past family history, past medical history, past social history, past surgical history and problem list.   Health Maintenance:   Diagnosis  Date Value Ref Range Status  10/02/2021   Final   - Negative for intraepithelial lesion or malignancy (NILM)    Review of Systems:  Pertinent items noted in HPI and remainder of comprehensive ROS otherwise negative.  Physical Exam:  BP 110/76    Pulse 81    Wt 126 lb (57.2 kg)    LMP 11/18/2021 (Exact Date)    BMI 24.61 kg/m  CONSTITUTIONAL: Well-developed, well-nourished female in no acute distress.  HEENT:  Normocephalic, atraumatic. External right and left ear normal. No scleral icterus.  NECK: Normal range of motion, supple, no masses noted on observation SKIN: No rash noted. Not diaphoretic. No erythema. No pallor. MUSCULOSKELETAL: Normal range of motion. No edema noted. NEUROLOGIC: Alert and oriented to person, place, and time. Normal muscle tone coordination. No cranial nerve deficit noted. PSYCHIATRIC: Normal mood and affect. Normal behavior. Normal judgment and thought content.  CARDIOVASCULAR: Normal heart rate noted RESPIRATORY: Effort and breath sounds normal, no problems with respiration noted ABDOMEN: No masses  noted. No other overt distention noted.    PELVIC: Normal appearing external genitalia; normal urethral meatus; normal appearing vaginal mucosa and cervix. IUD was in the vaginal vault.   No abnormal discharge noted.  Normal uterine size, no other palpable masses, no uterine or adnexal tenderness. Performed in the presence of a chaperone  Labs and Imaging No results found for this or any previous visit (from the past 168 hour(s)). US PELVIC COMPLETE WITH TRANSVAGINAL  Result Date: 11/25/2021 CLINICAL DATA:  Menorrhagia in a 46 year old female. Also with reported recent history of IUD placement. EXAM: TRANSABDOMINAL AND TRANSVAGINAL ULTRASOUND OF PELVIS TECHNIQUE: Both transabdominal and transvaginal ultrasound examinations of the pelvis were performed. Transabdominal technique was performed for global imaging of the pelvis including uterus, ovaries, adnexal regions, and pelvic cul-de-sac. It was necessary to proceed with endovaginal exam following the transabdominal exam to visualize the uterus and endometrium as well as large leiomyoma. Some of the boundaries are slightly obscured and this obscures the endometrium as well. Question of RIGHT ovary. COMPARISON:  None FINDINGS: Uterus Measurements: Retroverted uterus measures 8.3 x 6.6 x 7.3 cm = volume: 209 mL. Mass in the anterior mid and fundal portion of the uterus compatible with leiomyoma shows posterior acoustic shadowing and largely obscures the endometrium. This arises near the endometrial/myometrial junction. This appears well-circumscribed on some images and not as well circumscribed on other images. Endometrium Thickness: 4.5 mm where it can be visualized.  Not well assessed. Right ovary Measurements: 3.5 x 2.4 x 2.0 cm = volume: 7.9 mL. 2.5  x 1.8 x 2.0 cm simple appearing cyst adjacent to the RIGHT ovary. Likely arising from the RIGHT ovary. Left ovary Measurements: 3.0 x 1.6 x 2.7 cm = volume: 6.8 mL. Normal appearance/no adnexal mass. Other  findings No abnormal free fluid. IMPRESSION: Large masslike area in the anterior uterus compatible with large leiomyoma with extensive subserosal component versus is focal adenomyoma. This largely obscures the endometrium on the current study. Endometrial thickness not well assessed due to obscured boundaries secondary to leiomyoma or adenomyosis. MRI may be helpful as an initial means of further evaluating the endometrium which is not well evaluated currently and for definitive characterization to guide further management with respect to the uterine mass. IUD is reportedly in place and cannot be visualized. This may also be due to the presence of mass obscuring the endometrium in general. Correlate with passage of IUD since placement. If there are worsening symptoms in the pelvis or in the abdomen could consider further imaging with cross-sectional imaging as warranted. There is no free fluid in the pelvis. 2.5 cm cyst of the RIGHT ovary versus para ovarian cyst. No specific follow-up imaging for this finding is recommended. These results will be called to the ordering clinician or representative by the Radiologist Assistant, and communication documented in the PACS or [employer]. Electronically Signed   By: Zetta Bills M.D.   On: 11/25/2021 10:06      OFFICE PROCEDURE NOTE  Brittany Banks is a 46 y.o. [id number] here for IUD insertion.   IUD Insertion Procedure Note Procedure: IUD insertion with Liletta UPT: Negative GC/CT testing: Offered and declines  Patient identified.  Risks, benefits and alternatives of procedure were discussed including irregular bleeding, cramping, infection, malpositioning or misplacement of the IUD outside the uterus which may require further procedure such as laparoscopy. Also discussed >99% contraception efficacy, increased risk of ectopic pregnancy with failure of method.   Emphasized that this did not protect against STIs, condoms recommended during all sexual  encounters. Consent signed. Time out performed.   Speculum inserted and cervix visualized, prepped with 3 swabs of betadine.   Grasped with a single tooth tenaculum. Uterus sounded to 7.5 cm, Cervical dilators used, and IUD then inserted without difficulty per manufacturer's instructions and strings cut to 3 cm below cervical os and all instruments removed. Pt tolerated well with minimal pain and bleeding.   Discussed concerning signs/symptoms and to call if heavy bleeding, severe abdominal pain, or fever in the following 3 weeks. Manufacturer pamphlet/patient information given. Reviewed timing of efficacy for contraception and to use an alternative form of birth control until that time.  Assessment and Plan:  IUD check up - IUD expulsion occurred. She would like new IUD placed.  - Discussed the risk, benefits, and alternatives to an IUD.  Risks include but are not limited to: bleeding, infection, uterine perforation with misplacement of the IUD, expulsion (1%), changes to bleeding patterns with some women experiencing amenorrhea. We discussed the time it takes for interval improvement in bleeding.  - Consent reviewed and signed. See procedure note for details. No complications. Discussed precautions and concerning signs/symptoms afterward - i.e. symptoms of expulsion, fever, etc.  - Reminder card given and information pamphlet given.  Uterine leiomyoma, unspecified location - Uterine fibroids: The patient's fibroids are symptomatic and treatment options of expectant management, medical therapy, and surgical therapy were discussed. - Expectant management-The patient's fibroids were discussed and expectant management was offered with strict precautions. - Progesterone Only Birth Control Pills (POP)- The use  of progesterone only birth control pills was discussed with the patient. The control of fibroid symptoms was discussed and the risks/benefits of therapy were discussed. The fact that this type of  therapy does not contain estrogen was discussed with the patient.  Proper use was also discussed. - Depo Provera-The use of Depo Provera was discussed with the patient. The control of fibroid symptoms was discussed and the risks/benefits of therapy were discussed. The fact that this type of therapy does not contain estrogen was discussed with the patient. Induction of amenorrhea was discussed as well as the risks of osteoporosis. Bone loss prevention strategies were discussed. Proper use was also discussed. - Ln IUD- The use of the Mirena IUD was discussed with the patient. The control of fibroid symptoms was discussed and the risks/benefits of therapy were discussed. Contraindications and risks/benefits were discussed in detail. The fact that this type of therapy does not contain estrogen was discussed with the patient. Induction of amenorrhea was also discussed. - Elagolix reviewed.  - [place]-The patient was offered [place] and the procedure was detailed and discussed. Risks and benefits were discussed. Patient was offered a referral to the interventional radiology group. - Discussed RFA but proximity to bladder and transmural nature makes me hesitate on this as a first line.  - Additional surgical management includes myomectomy or hysterectomy. The risks and benefits of these surgeries were reviewed as well in depth. We discussed the types of fibroids that are candidates for hysteroscopic resection of fibroids as well.  - Following counseling, the patient would like to do LnIUD with TXA and reassess once more fully effective -     tranexamic acid (LYSTEDA) 650 MG TABS tablet; Take 2 tablets (1,300 mg total) by mouth 3 (three) times daily. Take during menses for a maximum of five days  Menorrhagia with regular cycle -     tranexamic acid (LYSTEDA) 650 MG TABS tablet; Take 2 tablets (1,300 mg total) by mouth 3 (three) times daily. Take during menses for a maximum of five days  Routine preventative health  maintenance measures emphasized. Please refer to After Visit Summary for other counseling recommendations.   No follow-ups on file.  Radene Gunning, MD, [place] for [facility], [place]

## 2021-12-31 ENCOUNTER — Other Ambulatory Visit: Payer: Self-pay

## 2021-12-31 ENCOUNTER — Inpatient Hospital Stay: Payer: 59 | Attending: Hematology & Oncology

## 2021-12-31 ENCOUNTER — Inpatient Hospital Stay (HOSPITAL_BASED_OUTPATIENT_CLINIC_OR_DEPARTMENT_OTHER): Payer: 59 | Admitting: Family

## 2021-12-31 ENCOUNTER — Encounter: Payer: Self-pay | Admitting: Family

## 2021-12-31 VITALS — BP 120/76 | HR 72 | Temp 98.3°F | Resp 17 | Wt 129.8 lb

## 2021-12-31 DIAGNOSIS — N920 Excessive and frequent menstruation with regular cycle: Secondary | ICD-10-CM | POA: Insufficient documentation

## 2021-12-31 DIAGNOSIS — D500 Iron deficiency anemia secondary to blood loss (chronic): Secondary | ICD-10-CM | POA: Insufficient documentation

## 2021-12-31 LAB — RETICULOCYTES
Immature Retic Fract: 7.1 % (ref 2.3–15.9)
RBC.: 3.84 MIL/uL — ABNORMAL LOW (ref 3.87–5.11)
Retic Count, Absolute: 57.6 10*3/uL (ref 19.0–186.0)
Retic Ct Pct: 1.5 % (ref 0.4–3.1)

## 2021-12-31 LAB — CBC WITH DIFFERENTIAL (CANCER CENTER ONLY)
Abs Immature Granulocytes: 0.03 10*3/uL (ref 0.00–0.07)
Basophils Absolute: 0.1 10*3/uL (ref 0.0–0.1)
Basophils Relative: 2 %
Eosinophils Absolute: 0.4 10*3/uL (ref 0.0–0.5)
Eosinophils Relative: 5 %
HCT: 33 % — ABNORMAL LOW (ref 36.0–46.0)
Hemoglobin: 10.9 g/dL — ABNORMAL LOW (ref 12.0–15.0)
Immature Granulocytes: 0 %
Lymphocytes Relative: 28 %
Lymphs Abs: 2.4 10*3/uL (ref 0.7–4.0)
MCH: 28.3 pg (ref 26.0–34.0)
MCHC: 33 g/dL (ref 30.0–36.0)
MCV: 85.7 fL (ref 80.0–100.0)
Monocytes Absolute: 0.6 10*3/uL (ref 0.1–1.0)
Monocytes Relative: 7 %
Neutro Abs: 4.9 10*3/uL (ref 1.7–7.7)
Neutrophils Relative %: 58 %
Platelet Count: 373 10*3/uL (ref 150–400)
RBC: 3.85 MIL/uL — ABNORMAL LOW (ref 3.87–5.11)
RDW: 13.1 % (ref 11.5–15.5)
WBC Count: 8.4 10*3/uL (ref 4.0–10.5)
nRBC: 0 % (ref 0.0–0.2)

## 2021-12-31 NOTE — Progress Notes (Signed)
?Hematology and Oncology Follow Up Visit ? ?Brittany Banks ?[id number] ?16-Jun-1976 46 y.o. ?12/31/2021 ? ? ?Principle Diagnosis:  ?Iron deficiency anemia secondary to heavy cycle ?  ?Current Therapy:        ?IV iron as indicated  ?  ?Interim History:  Brittany Banks is here today for follow-up. She is doing well and has no complaints at this time.  ?She states that with the IUD her cycle is regular and only the first day is heavy. No other blood loss noted. No bruising, no petechiae.  ?No fever, chills, n/v, cough, rash, dizziness, SOB, chest pain, palpitations, abdominal pain or changes in bowel or bladder habits.  ?No swelling, tenderness, numbness or tingling in her extremities.  ?No falls or syncope.  ?She has been eating well and continues to follow a vegetarian diet. She is hydrating well and her weight is 129 lbs.  ? ?ECOG Performance Status: 1 - Symptomatic but completely ambulatory ? ?Medications:  ?Allergies as of 12/31/2021   ?No Known Allergies ?  ? ?  ?Medication List  ?  ? ?  ? Accurate as of December 31, 2021  2:38 PM. If you have any questions, ask your nurse or doctor.  ?  ?  ? ?  ? ?levonorgestrel 20 MCG/DAY Iud ?Commonly known as: MIRENA ?1 each by Intrauterine route once. ?  ?One-A-Day Womens Formula Tabs ?Take 1 tablet by mouth daily. ?  ?tranexamic acid 650 MG Tabs tablet ?Commonly known as: LYSTEDA ?Take 2 tablets (1,300 mg total) by mouth 3 (three) times daily. Take during menses for a maximum of five days ?  ? ?  ? ? ?Allergies: No Known Allergies ? ?Past Medical History, Surgical history, Social history, and Family History were reviewed and updated. ? ?Review of Systems: ?All other 10 point review of systems is negative.  ? ?Physical Exam: ? vitals were not taken for this visit.  ? ?Wt Readings from Last 3 Encounters:  ?11/25/21 126 lb (57.2 kg)  ?10/02/21 125 lb (56.7 kg)  ?10/02/21 125 lb (56.7 kg)  ? ? ?Ocular: Sclerae unicteric, pupils equal, round and reactive to light ?Ear-nose-throat: Oropharynx  clear, dentition fair ?Lymphatic: No cervical or supraclavicular adenopathy ?Lungs no rales or rhonchi, good excursion bilaterally ?Heart regular rate and rhythm, no murmur appreciated ?Abd soft, nontender, positive bowel sounds ?MSK no focal spinal tenderness, no joint edema ?Neuro: non-focal, well-oriented, appropriate affect ?Breasts: Deferred ? ?Lab Results  ?Component Value Date  ? WBC 8.4 12/31/2021  ? HGB 10.9 (L) 12/31/2021  ? HCT 33.0 (L) 12/31/2021  ? MCV 85.7 12/31/2021  ? PLT 373 12/31/2021  ? ?Lab Results  ?Component Value Date  ? FERRITIN 210 10/01/2021  ? IRON 101 10/01/2021  ? TIBC 275 10/01/2021  ? UIBC 174 10/01/2021  ? IRONPCTSAT 37 10/01/2021  ? ?Lab Results  ?Component Value Date  ? RETICCTPCT 1.5 12/31/2021  ? RBC 3.84 (L) 12/31/2021  ? ?No results found for: KPAFRELGTCHN, LAMBDASER, KAPLAMBRATIO ?No results found for: IGGSERUM, IGA, IGMSERUM ?No results found for: TOTALPROTELP, ALBUMINELP, A1GS, A2GS, BETS, BETA2SER, GAMS, MSPIKE, SPEI ?  Chemistry   ?   ?Component Value Date/Time  ? NA 138 07/26/2021 1054  ? K 4.1 07/26/2021 1054  ? CL 105 07/26/2021 1054  ? CO2 25 07/26/2021 1054  ? BUN 13 07/26/2021 1054  ? CREATININE 0.58 07/26/2021 1054  ? CREATININE 0.60 01/21/2018 1428  ?    ?Component Value Date/Time  ? CALCIUM 9.2 07/26/2021 1054  ? ALKPHOS 75  07/26/2021 1054  ? AST 13 07/26/2021 1054  ? AST 22 01/21/2018 1428  ? ALT 14 07/26/2021 1054  ? ALT 19 01/21/2018 1428  ? BILITOT 0.7 07/26/2021 1054  ? BILITOT 0.6 01/21/2018 1428  ?  ? ? ? ?Impression and Plan: Brittany Banks is a very pleasant 46 yo female with iron deficiency anemia secondary to heavy cycles.  ?Iron studies are pending. We will replace if needed.  ?Follow-up in 3 months.  ? ?[name], NP ?3/7/20232:38 PM ? ?

## 2022-01-01 LAB — IRON AND IRON BINDING CAPACITY (CC-WL,HP ONLY)
Iron: 37 ug/dL (ref 28–170)
Saturation Ratios: 9 % — ABNORMAL LOW (ref 10.4–31.8)
TIBC: 414 ug/dL (ref 250–450)
UIBC: 377 ug/dL (ref 148–442)

## 2022-01-01 LAB — FERRITIN: Ferritin: 22 ng/mL (ref 11–307)

## 2022-01-03 ENCOUNTER — Telehealth: Payer: Self-pay

## 2022-01-03 ENCOUNTER — Telehealth: Payer: Self-pay | Admitting: *Deleted

## 2022-01-03 NOTE — Telephone Encounter (Signed)
Advised via MyChart.

## 2022-01-03 NOTE — Telephone Encounter (Signed)
-----   Message from Volanda Napoleon, MD sent at 01/02/2022  5:58 PM EST ----- ?Please call and let her know that the iron is quite low.  She needs IV iron.  Please set this up.  Thanks.  Pete ?

## 2022-01-06 ENCOUNTER — Telehealth: Payer: Self-pay | Admitting: *Deleted

## 2022-01-06 NOTE — Telephone Encounter (Signed)
Per 12/31/21 los - called and gave upcoming appointments ?

## 2022-01-07 ENCOUNTER — Telehealth: Payer: Self-pay

## 2022-01-07 NOTE — Telephone Encounter (Signed)
Patient had questions when setting up infusion appointments so this RN called patient to clarify any questions. Pt asking if she can take oral iron instead of IV infusions; pt educated that her iron saturations were 9 and that IV iron will bring her saturations up quicker than oral absorption. Pt educated that she can talk to MD once she has received IV infusions to see if she can be maintained on oral iron. Pt also asking about her WBC count on her labs; pt educated that her WBC count is within normal ranges. Pt verbalized understanding and had no further questions.  ?

## 2022-01-14 ENCOUNTER — Other Ambulatory Visit: Payer: Self-pay

## 2022-01-14 ENCOUNTER — Inpatient Hospital Stay: Payer: 59

## 2022-01-14 VITALS — BP 105/61 | HR 62 | Temp 98.1°F | Resp 20

## 2022-01-14 DIAGNOSIS — D500 Iron deficiency anemia secondary to blood loss (chronic): Secondary | ICD-10-CM | POA: Diagnosis not present

## 2022-01-14 MED ORDER — DIPHENHYDRAMINE HCL 25 MG PO CAPS
25.0000 mg | ORAL_CAPSULE | Freq: Once | ORAL | Status: AC
  Administered 2022-01-14: 25 mg via ORAL
  Filled 2022-01-14: qty 1

## 2022-01-14 MED ORDER — FAMOTIDINE IN NACL 20-0.9 MG/50ML-% IV SOLN
20.0000 mg | Freq: Once | INTRAVENOUS | Status: AC
  Administered 2022-01-14: 20 mg via INTRAVENOUS
  Filled 2022-01-14: qty 50

## 2022-01-14 MED ORDER — SODIUM CHLORIDE 0.9 % IV SOLN
510.0000 mg | Freq: Once | INTRAVENOUS | Status: AC
  Administered 2022-01-14: 510 mg via INTRAVENOUS
  Filled 2022-01-14: qty 17

## 2022-01-14 MED ORDER — SODIUM CHLORIDE 0.9 % IV SOLN: Freq: Once | INTRAVENOUS | Status: AC

## 2022-01-14 MED ORDER — METHYLPREDNISOLONE SODIUM SUCC 125 MG IJ SOLR
60.0000 mg | Freq: Once | INTRAMUSCULAR | Status: AC
  Administered 2022-01-14: 60 mg via INTRAVENOUS
  Filled 2022-01-14: qty 2

## 2022-01-14 MED ORDER — FAMOTIDINE 20 MG IN NS 100 ML IVPB: 20.0000 mg | Freq: Once | INTRAVENOUS | Status: DC

## 2022-01-14 NOTE — Patient Instructions (Signed)

## 2022-01-21 ENCOUNTER — Other Ambulatory Visit: Payer: Self-pay

## 2022-01-21 ENCOUNTER — Inpatient Hospital Stay: Payer: 59

## 2022-01-21 VITALS — BP 113/71 | HR 72 | Temp 98.1°F | Resp 18

## 2022-01-21 DIAGNOSIS — D500 Iron deficiency anemia secondary to blood loss (chronic): Secondary | ICD-10-CM | POA: Diagnosis not present

## 2022-01-21 MED ORDER — FAMOTIDINE IN NACL 20-0.9 MG/50ML-% IV SOLN
20.0000 mg | Freq: Once | INTRAVENOUS | Status: AC
  Administered 2022-01-21: 20 mg via INTRAVENOUS
  Filled 2022-01-21: qty 50

## 2022-01-21 MED ORDER — METHYLPREDNISOLONE SODIUM SUCC 125 MG IJ SOLR
60.0000 mg | Freq: Once | INTRAMUSCULAR | Status: AC
  Administered 2022-01-21: 60 mg via INTRAVENOUS
  Filled 2022-01-21: qty 2

## 2022-01-21 MED ORDER — SODIUM CHLORIDE 0.9 % IV SOLN
510.0000 mg | Freq: Once | INTRAVENOUS | Status: AC
  Administered 2022-01-21: 510 mg via INTRAVENOUS
  Filled 2022-01-21: qty 510

## 2022-01-21 MED ORDER — DIPHENHYDRAMINE HCL 25 MG PO CAPS
25.0000 mg | ORAL_CAPSULE | Freq: Once | ORAL | Status: AC
  Administered 2022-01-21: 25 mg via ORAL
  Filled 2022-01-21: qty 1

## 2022-01-21 MED ORDER — SODIUM CHLORIDE 0.9 % IV SOLN: Freq: Once | INTRAVENOUS | Status: AC

## 2022-01-21 NOTE — Patient Instructions (Signed)
Ferumoxytol Injection (Feraheme) What is this medication? FERUMOXYTOL (FER ue MOX i tol) treats low levels of iron in your body (iron deficiency anemia). Iron is a mineral that plays an important role in making red blood cells, which carry oxygen from your lungs to the rest of your body. This medicine may be used for other purposes; ask your health care provider or pharmacist if you have questions. COMMON BRAND NAME(S): Feraheme What should I tell my care team before I take this medication? They need to know if you have any of these conditions: Anemia not caused by low iron levels High levels of iron in the blood Magnetic resonance imaging (MRI) test scheduled An unusual or allergic reaction to iron, other medications, foods, dyes, or preservatives Pregnant or trying to get pregnant Breast-feeding How should I use this medication? This medication is for injection into a vein. It is given in a hospital or clinic setting. Talk to your care team the use of this medication in children. Special care may be needed. Overdosage: If you think you have taken too much of this medicine contact a poison control center or emergency room at once. NOTE: This medicine is only for you. Do not share this medicine with others. What if I miss a dose? It is important not to miss your dose. Call your care team if you are unable to keep an appointment. What may interact with this medication? Other iron products This list may not describe all possible interactions. Give your health care provider a list of all the medicines, herbs, non-prescription drugs, or dietary supplements you use. Also tell them if you smoke, drink alcohol, or use illegal drugs. Some items may interact with your medicine. What should I watch for while using this medication? Visit your care team regularly. Tell your care team if your symptoms do not start to get better or if they get worse. You may need blood work done while you are taking this  medication. You may need to follow a special diet. Talk to your care team. Foods that contain iron include: whole grains/cereals, dried fruits, beans, or peas, leafy green vegetables, and organ meats (liver, kidney). What side effects may I notice from receiving this medication? Side effects that you should report to your care team as soon as possible: Allergic reactions--skin rash, itching, hives, swelling of the face, lips, tongue, or throat Low blood pressure--dizziness, feeling faint or lightheaded, blurry vision Shortness of breath Side effects that usually do not require medical attention (report to your care team if they continue or are bothersome): Flushing Headache Joint pain Muscle pain Nausea Pain, redness, or irritation at injection site This list may not describe all possible side effects. Call your doctor for medical advice about side effects. You may report side effects to FDA at 1-800-FDA-1088. Where should I keep my medication? This medication is given in a hospital or clinic and will not be stored at home. NOTE: This sheet is a summary. It may not cover all possible information. If you have questions about this medicine, talk to your doctor, pharmacist, or health care provider.  2022 Elsevier/Gold Standard (2021-03-08 00:00:00)  

## 2022-04-02 ENCOUNTER — Inpatient Hospital Stay (HOSPITAL_BASED_OUTPATIENT_CLINIC_OR_DEPARTMENT_OTHER): Payer: 59 | Admitting: Family

## 2022-04-02 ENCOUNTER — Inpatient Hospital Stay: Payer: 59 | Attending: Hematology & Oncology

## 2022-04-02 ENCOUNTER — Encounter: Payer: Self-pay | Admitting: Family

## 2022-04-02 VITALS — BP 119/70 | HR 68 | Temp 97.8°F | Resp 17 | Wt 133.1 lb

## 2022-04-02 DIAGNOSIS — N920 Excessive and frequent menstruation with regular cycle: Secondary | ICD-10-CM | POA: Insufficient documentation

## 2022-04-02 DIAGNOSIS — D500 Iron deficiency anemia secondary to blood loss (chronic): Secondary | ICD-10-CM | POA: Diagnosis not present

## 2022-04-02 LAB — CBC WITH DIFFERENTIAL (CANCER CENTER ONLY)
Abs Immature Granulocytes: 0.11 10*3/uL — ABNORMAL HIGH (ref 0.00–0.07)
Basophils Absolute: 0.1 10*3/uL (ref 0.0–0.1)
Basophils Relative: 1 %
Eosinophils Absolute: 0.5 10*3/uL (ref 0.0–0.5)
Eosinophils Relative: 6 %
HCT: 37.5 % (ref 36.0–46.0)
Hemoglobin: 12 g/dL (ref 12.0–15.0)
Immature Granulocytes: 1 %
Lymphocytes Relative: 23 %
Lymphs Abs: 2 10*3/uL (ref 0.7–4.0)
MCH: 28.3 pg (ref 26.0–34.0)
MCHC: 32 g/dL (ref 30.0–36.0)
MCV: 88.4 fL (ref 80.0–100.0)
Monocytes Absolute: 0.6 10*3/uL (ref 0.1–1.0)
Monocytes Relative: 7 %
Neutro Abs: 5.4 10*3/uL (ref 1.7–7.7)
Neutrophils Relative %: 62 %
Platelet Count: 269 10*3/uL (ref 150–400)
RBC: 4.24 MIL/uL (ref 3.87–5.11)
RDW: 15 % (ref 11.5–15.5)
WBC Count: 8.7 10*3/uL (ref 4.0–10.5)
nRBC: 0 % (ref 0.0–0.2)

## 2022-04-02 LAB — RETICULOCYTES
Immature Retic Fract: 9.5 % (ref 2.3–15.9)
RBC.: 4.21 MIL/uL (ref 3.87–5.11)
Retic Count, Absolute: 82.5 10*3/uL (ref 19.0–186.0)
Retic Ct Pct: 2 % (ref 0.4–3.1)

## 2022-04-02 LAB — FERRITIN: Ferritin: 182 ng/mL (ref 11–307)

## 2022-04-02 NOTE — Progress Notes (Signed)
Hematology and Oncology Follow Up Visit  Brittany Banks [id number] 07-13-1976 46 y.o. 04/02/2022   Principle Diagnosis:  Iron deficiency anemia secondary to heavy cycle   Current Therapy:        IV iron as indicated    Interim History:  Brittany Banks is here today for follow-up. She is doing well an dhas no complaints at this time.  She states that her cycle is regular and flow seems to be a little less.  No other blood loss noted. No bruising or petechiae.  No fever, chills, n/v, cough, rash, dizziness, SOB, chest pain, palpitations, abdominal pain or changes in bowel or bladder habits.  No swelling, tenderness, numbness or tingling in her extremities.  No falls or syncope reported.  Appetite and hydration are good. Her weight is stable at 133 lbs.   ECOG Performance Status: 0 - Asymptomatic  Medications:  Allergies as of 04/02/2022   No Known Allergies      Medication List        Accurate as of April 02, 2022  1:39 PM. If you have any questions, ask your nurse or doctor.          levonorgestrel 20 MCG/DAY Iud Commonly known as: MIRENA 1 each by Intrauterine route once.   One-A-Day Womens Formula Tabs Take 1 tablet by mouth daily.   tranexamic acid 650 MG Tabs tablet Commonly known as: LYSTEDA Take 2 tablets (1,300 mg total) by mouth 3 (three) times daily. Take during menses for a maximum of five days        Allergies: No Known Allergies  Past Medical History, Surgical history, Social history, and Family History were reviewed and updated.  Review of Systems: All other 10 point review of systems is negative.   Physical Exam:  vitals were not taken for this visit.   Wt Readings from Last 3 Encounters:  12/31/21 129 lb 12.8 oz (58.9 kg)  11/25/21 126 lb (57.2 kg)  10/02/21 125 lb (56.7 kg)    Ocular: Sclerae unicteric, pupils equal, round and reactive to light Ear-nose-throat: Oropharynx clear, dentition fair Lymphatic: No cervical or supraclavicular  adenopathy Lungs no rales or rhonchi, good excursion bilaterally Heart regular rate and rhythm, no murmur appreciated Abd soft, nontender, positive bowel sounds MSK no focal spinal tenderness, no joint edema Neuro: non-focal, well-oriented, appropriate affect Breasts: Deferred   Lab Results  Component Value Date   WBC 8.7 04/02/2022   HGB 12.0 04/02/2022   HCT 37.5 04/02/2022   MCV 88.4 04/02/2022   PLT 269 04/02/2022   Lab Results  Component Value Date   FERRITIN 22 12/31/2021   IRON 37 12/31/2021   TIBC 414 12/31/2021   UIBC 377 12/31/2021   IRONPCTSAT 9 (L) 12/31/2021   Lab Results  Component Value Date   RETICCTPCT 2.0 04/02/2022   RBC 4.21 04/02/2022   RBC 4.24 04/02/2022   No results found for: KPAFRELGTCHN, LAMBDASER, KAPLAMBRATIO No results found for: IGGSERUM, IGA, IGMSERUM No results found for: Odetta Pink, SPEI   Chemistry      Component Value Date/Time   NA 138 07/26/2021 1054   K 4.1 07/26/2021 1054   CL 105 07/26/2021 1054   CO2 25 07/26/2021 1054   BUN 13 07/26/2021 1054   CREATININE 0.58 07/26/2021 1054   CREATININE 0.60 01/21/2018 1428      Component Value Date/Time   CALCIUM 9.2 07/26/2021 1054   ALKPHOS 75 07/26/2021 1054   AST 13 07/26/2021  1054   AST 22 01/21/2018 1428   ALT 14 07/26/2021 1054   ALT 19 01/21/2018 1428   BILITOT 0.7 07/26/2021 1054   BILITOT 0.6 01/21/2018 1428       Impression and Plan: Brittany Banks is a very pleasant 46 yo female with iron deficiency anemia secondary to heavy cycles.  Iron studies are pending.  Follow-up in 3 months.   Brittany Dawson, NP 6/7/20231:39 PM

## 2022-04-03 LAB — IRON AND IRON BINDING CAPACITY (CC-WL,HP ONLY)
Iron: 106 ug/dL (ref 28–170)
Saturation Ratios: 35 % — ABNORMAL HIGH (ref 10.4–31.8)
TIBC: 307 ug/dL (ref 250–450)
UIBC: 201 ug/dL (ref 148–442)

## 2022-04-08 ENCOUNTER — Telehealth: Payer: Self-pay

## 2022-04-08 NOTE — Telephone Encounter (Signed)
Pt called and requested iron results. Called and informed pt sarah reviewed labs and iron looks good. No iron needed right now. Pt verbalized understanding and denies any concerns.

## 2022-07-04 ENCOUNTER — Inpatient Hospital Stay: Payer: 59

## 2022-07-04 ENCOUNTER — Encounter: Payer: Self-pay | Admitting: Family

## 2022-07-04 ENCOUNTER — Inpatient Hospital Stay: Payer: 59 | Admitting: Family

## 2022-07-15 ENCOUNTER — Inpatient Hospital Stay: Payer: 59 | Attending: Family | Admitting: Family

## 2022-07-15 ENCOUNTER — Other Ambulatory Visit: Payer: Self-pay | Admitting: Family

## 2022-07-15 ENCOUNTER — Other Ambulatory Visit: Payer: Self-pay

## 2022-07-15 ENCOUNTER — Encounter: Payer: Self-pay | Admitting: Family

## 2022-07-15 ENCOUNTER — Inpatient Hospital Stay: Payer: 59

## 2022-07-15 VITALS — BP 94/68 | HR 65 | Temp 99.0°F | Resp 18 | Ht 59.84 in | Wt 135.8 lb

## 2022-07-15 DIAGNOSIS — D509 Iron deficiency anemia, unspecified: Secondary | ICD-10-CM | POA: Diagnosis not present

## 2022-07-15 DIAGNOSIS — N920 Excessive and frequent menstruation with regular cycle: Secondary | ICD-10-CM | POA: Insufficient documentation

## 2022-07-15 DIAGNOSIS — D500 Iron deficiency anemia secondary to blood loss (chronic): Secondary | ICD-10-CM

## 2022-07-15 LAB — CBC WITH DIFFERENTIAL (CANCER CENTER ONLY)
Abs Immature Granulocytes: 0.02 10*3/uL (ref 0.00–0.07)
Basophils Absolute: 0.1 10*3/uL (ref 0.0–0.1)
Basophils Relative: 1 %
Eosinophils Absolute: 0.4 10*3/uL (ref 0.0–0.5)
Eosinophils Relative: 5 %
HCT: 37.1 % (ref 36.0–46.0)
Hemoglobin: 12.1 g/dL (ref 12.0–15.0)
Immature Granulocytes: 0 %
Lymphocytes Relative: 27 %
Lymphs Abs: 2.2 10*3/uL (ref 0.7–4.0)
MCH: 28.3 pg (ref 26.0–34.0)
MCHC: 32.6 g/dL (ref 30.0–36.0)
MCV: 86.7 fL (ref 80.0–100.0)
Monocytes Absolute: 0.6 10*3/uL (ref 0.1–1.0)
Monocytes Relative: 7 %
Neutro Abs: 5 10*3/uL (ref 1.7–7.7)
Neutrophils Relative %: 60 %
Platelet Count: 306 10*3/uL (ref 150–400)
RBC: 4.28 MIL/uL (ref 3.87–5.11)
RDW: 12.7 % (ref 11.5–15.5)
WBC Count: 8.2 10*3/uL (ref 4.0–10.5)
nRBC: 0 % (ref 0.0–0.2)

## 2022-07-15 LAB — RETICULOCYTES
Immature Retic Fract: 8.5 % (ref 2.3–15.9)
RBC.: 4.23 MIL/uL (ref 3.87–5.11)
Retic Count, Absolute: 67.3 10*3/uL (ref 19.0–186.0)
Retic Ct Pct: 1.6 % (ref 0.4–3.1)

## 2022-07-15 LAB — FERRITIN: Ferritin: 44 ng/mL (ref 11–307)

## 2022-07-15 NOTE — Progress Notes (Signed)
Hematology and Oncology Follow Up Visit  Newell Frater [id number] 07-29-1976 46 y.o. 07/15/2022   Principle Diagnosis:  Iron deficiency anemia secondary to heavy cycle   Current Therapy:        IV iron as indicated    Interim History:  Ms. Medero is here today for follow-up. She is doing quite well and has no complaints at this time.  She states that her cycles is regular with lighter flow and shorter in length.  No other blood loss noted. No bruising or petechiae.  No fever, chills, n/v, cough, rash, dizziness, SOB, chest pain, palpitations, abdominal pain or changes in bowel or bladder habits.  No swelling, tenderness, numbness or tingling in her extremities.  No falls or syncope.  Appetite and hydration are good. Weight is stable at 135 lbs.   ECOG Performance Status: 1 - Symptomatic but completely ambulatory  Medications:  Allergies as of 07/15/2022   No Known Allergies      Medication List        Accurate as of July 15, 2022  3:08 PM. If you have any questions, ask your nurse or doctor.          levonorgestrel 20 MCG/DAY Iud Commonly known as: MIRENA 1 each by Intrauterine route once.   One-A-Day Womens Formula Tabs Take 1 tablet by mouth daily.   tranexamic acid 650 MG Tabs tablet Commonly known as: LYSTEDA Take 2 tablets (1,300 mg total) by mouth 3 (three) times daily. Take during menses for a maximum of five days        Allergies: No Known Allergies  Past Medical History, Surgical history, Social history, and Family History were reviewed and updated.  Review of Systems: All other 10 point review of systems is negative.   Physical Exam:  height is 4' 11.84" (1.52 m) and weight is 135 lb 12.8 oz (61.6 kg). Her oral temperature is 99 F (37.2 C). Her blood pressure is 94/68 and her pulse is 65. Her respiration is 18 and oxygen saturation is 100%.   Wt Readings from Last 3 Encounters:  07/15/22 135 lb 12.8 oz (61.6 kg)  04/02/22 133 lb 1.9 oz  (60.4 kg)  12/31/21 129 lb 12.8 oz (58.9 kg)    Ocular: Sclerae unicteric, pupils equal, round and reactive to light Ear-nose-throat: Oropharynx clear, dentition fair Lymphatic: No cervical or supraclavicular adenopathy Lungs no rales or rhonchi, good excursion bilaterally Heart regular rate and rhythm, no murmur appreciated Abd soft, nontender, positive bowel sounds MSK no focal spinal tenderness, no joint edema Neuro: non-focal, well-oriented, appropriate affect Breasts: Deferred   Lab Results  Component Value Date   WBC 8.2 07/15/2022   HGB 12.1 07/15/2022   HCT 37.1 07/15/2022   MCV 86.7 07/15/2022   PLT 306 07/15/2022   Lab Results  Component Value Date   FERRITIN 182 04/02/2022   IRON 106 04/02/2022   TIBC 307 04/02/2022   UIBC 201 04/02/2022   IRONPCTSAT 35 (H) 04/02/2022   Lab Results  Component Value Date   RETICCTPCT 1.6 07/15/2022   RBC 4.28 07/15/2022   RBC 4.23 07/15/2022   No results found for: "KPAFRELGTCHN", "LAMBDASER", "KAPLAMBRATIO" No results found for: "IGGSERUM", "IGA", "IGMSERUM" No results found for: "TOTALPROTELP", "ALBUMINELP", "A1GS", "A2GS", "BETS", "BETA2SER", "GAMS", "MSPIKE", "SPEI"   Chemistry      Component Value Date/Time   NA 138 07/26/2021 1054   K 4.1 07/26/2021 1054   CL 105 07/26/2021 1054   CO2 25 07/26/2021 1054   BUN  13 07/26/2021 1054   CREATININE 0.58 07/26/2021 1054   CREATININE 0.60 01/21/2018 1428      Component Value Date/Time   CALCIUM 9.2 07/26/2021 1054   ALKPHOS 75 07/26/2021 1054   AST 13 07/26/2021 1054   AST 22 01/21/2018 1428   ALT 14 07/26/2021 1054   ALT 19 01/21/2018 1428   BILITOT 0.7 07/26/2021 1054   BILITOT 0.6 01/21/2018 1428       Impression and Plan: Ms. Oviedo is a very pleasant 46 yo female with iron deficiency anemia secondary to heavy cycles.  Iron studies are pending.  Follow-up in 4 months.   [name], NP 9/19/20233:08 PM

## 2022-07-16 LAB — IRON AND IRON BINDING CAPACITY (CC-WL,HP ONLY)
Iron: 69 ug/dL (ref 28–170)
Saturation Ratios: 19 % (ref 10.4–31.8)
TIBC: 358 ug/dL (ref 250–450)
UIBC: 289 ug/dL (ref 148–442)

## 2022-10-04 IMAGING — MG DIGITAL SCREENING BILAT W/ TOMO W/ CAD
6 of 10 series · 6 of 30 positions shown · non-contrast
Comparison: Previous exam(s).

CLINICAL DATA: Screening.

EXAM:
DIGITAL SCREENING BILATERAL MAMMOGRAM WITH TOMO AND CAD

[L MLO synth-2D]
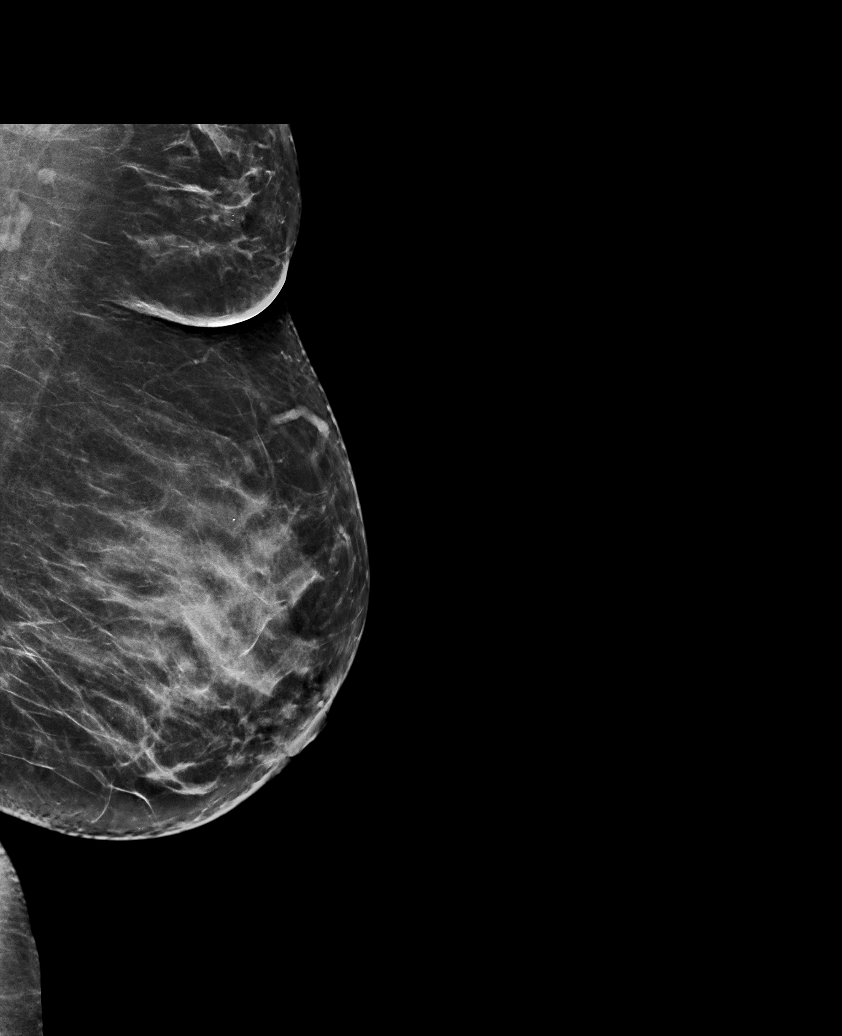

[L CC synth-2D]
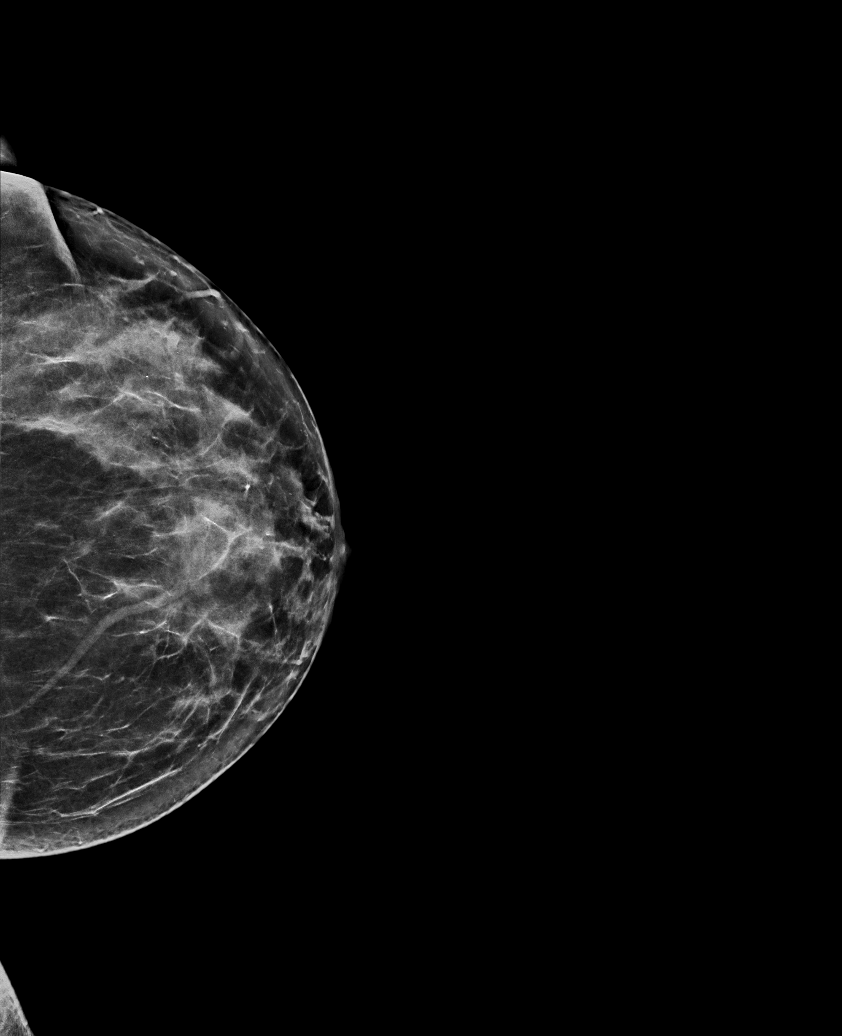

[R CC synth-2D]
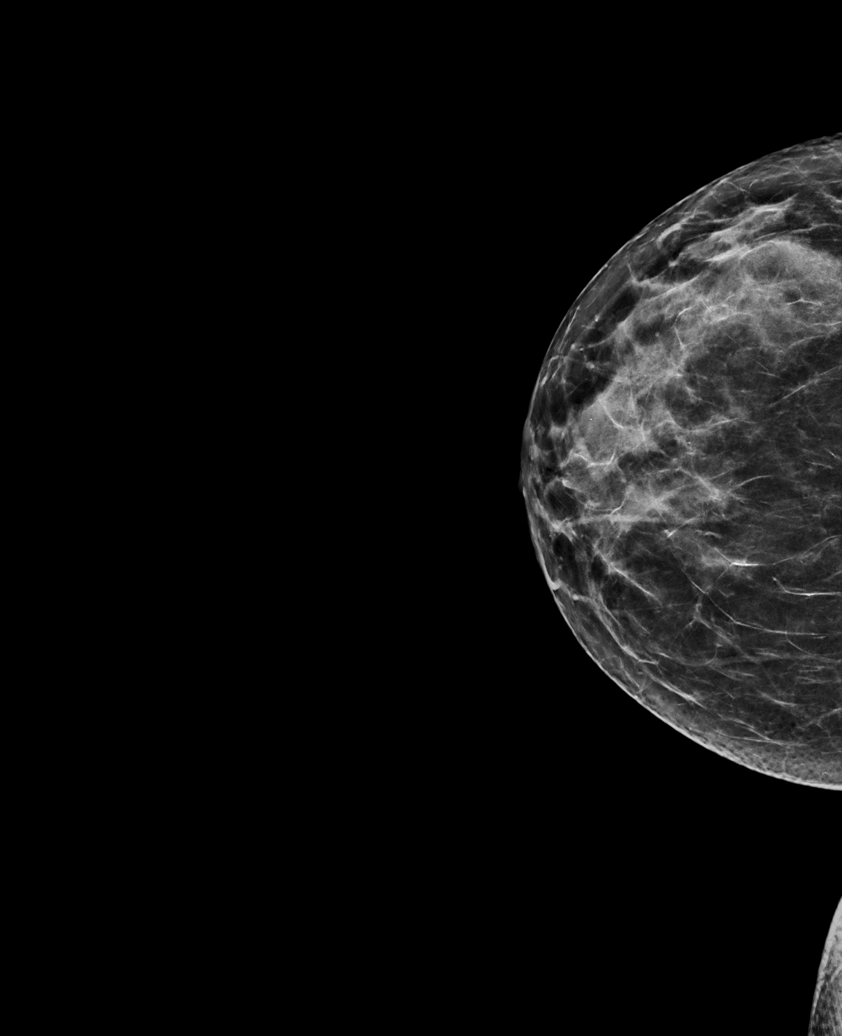

[R XCCL synth-2D]
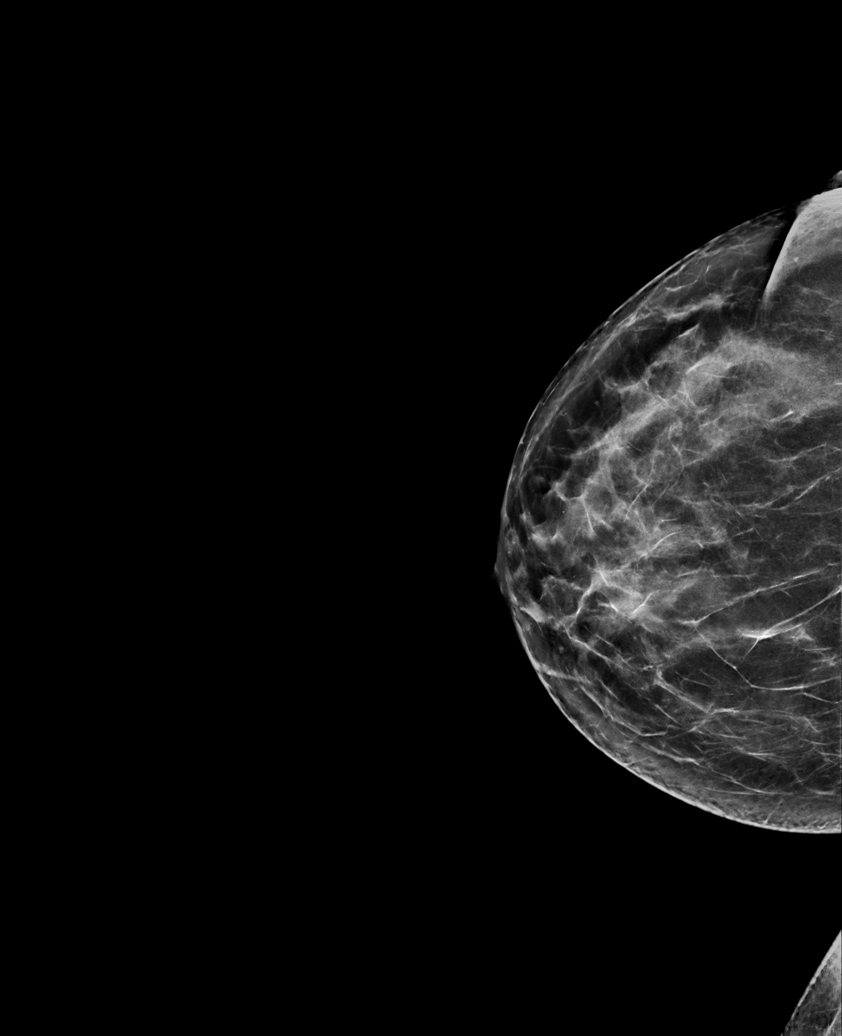

[R MLO synth-2D]
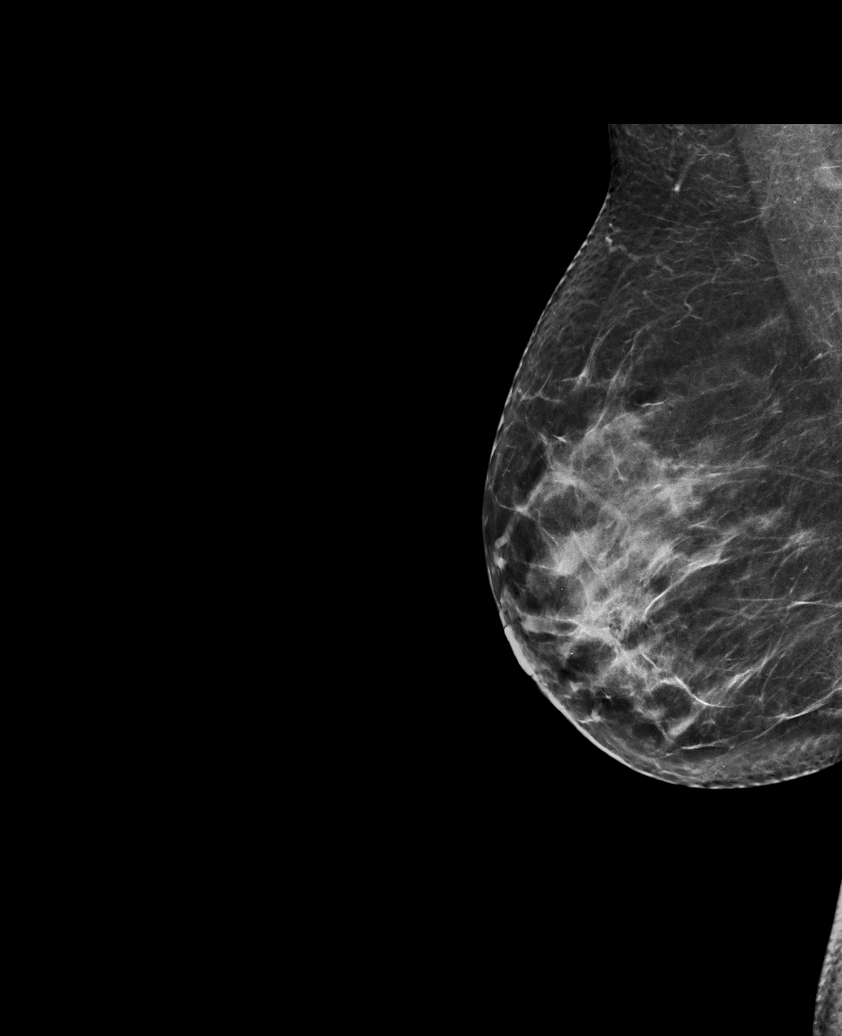

[R XCCL tomo · tomo slice 37/74.0]
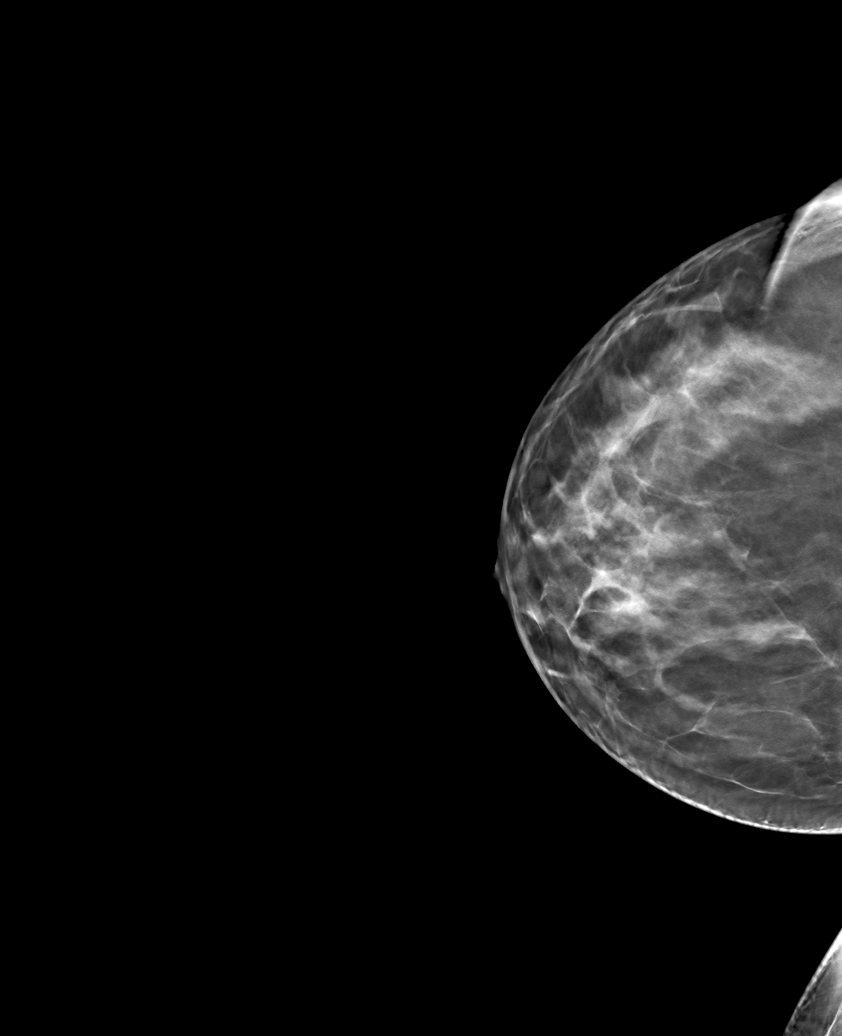

[6 of 30 positions shown; findings below may reference images not displayed]

ACR Breast Density Category c: The breast tissue is heterogeneously
dense, which may obscure small masses.
FINDINGS: In the right breast, a possible mass and a possible asymmetry
warrant further evaluation. The possible mass is seen within the
slightly outer RIGHT breast, at anterior depth, CC slice 32. The
additional possible asymmetry is seen within the posterior RIGHT
breast on MLO slice 34.

In the left breast, no findings suspicious for malignancy. Images
were processed with CAD.
IMPRESSION: Further evaluation is suggested for a possible mass and a possible
asymmetry in the right breast.

RECOMMENDATION:
Diagnostic mammogram and possibly ultrasound of the right breast.
(Code:[id number])

The patient will be contacted regarding the findings, and additional
imaging will be scheduled.

BI-RADS CATEGORY  0: Incomplete. Need additional imaging evaluation
and/or prior mammograms for comparison.

## 2022-10-09 ENCOUNTER — Ambulatory Visit: Payer: 59 | Admitting: Family Medicine

## 2022-11-21 ENCOUNTER — Inpatient Hospital Stay: Payer: 59 | Admitting: Family

## 2022-11-21 ENCOUNTER — Inpatient Hospital Stay: Payer: 59 | Attending: Hematology & Oncology

## 2023-11-25 DIAGNOSIS — Z1211 Encounter for screening for malignant neoplasm of colon: Secondary | ICD-10-CM | POA: Diagnosis not present

## 2023-11-25 DIAGNOSIS — K648 Other hemorrhoids: Secondary | ICD-10-CM | POA: Diagnosis not present

## 2023-11-25 DIAGNOSIS — K635 Polyp of colon: Secondary | ICD-10-CM | POA: Diagnosis not present

## 2023-11-25 DIAGNOSIS — D125 Benign neoplasm of sigmoid colon: Secondary | ICD-10-CM | POA: Diagnosis not present

## 2024-05-06 ENCOUNTER — Encounter: Payer: Self-pay | Admitting: Family

## 2024-05-06 ENCOUNTER — Ambulatory Visit: Admitting: Family

## 2024-05-06 VITALS — BP 120/80 | HR 81 | Resp 16 | Ht 59.0 in | Wt 137.0 lb

## 2024-05-06 DIAGNOSIS — J309 Allergic rhinitis, unspecified: Secondary | ICD-10-CM | POA: Insufficient documentation

## 2024-05-06 MED ORDER — CETIRIZINE HCL 10 MG PO TABS: 10.0000 mg | ORAL_TABLET | Freq: Every day | ORAL | Status: AC

## 2024-05-06 MED ORDER — AZELASTINE-FLUTICASONE 137-50 MCG/ACT NA SUSP: 1.0000 | Freq: Two times a day (BID) | NASAL | 5 refills | Status: AC

## 2024-05-06 NOTE — Assessment & Plan Note (Signed)
  Chronic nasal congestion likely due to environmental allergies, possibly seasonal pollen. Previous treatments insufficient. - Prescribed Flonase plus another nasal spray, one spray each nostril morning and evening. - Continue Zyrtec  daily at bedtime. - Consider switching to Claritin if Zyrtec  causes daytime drowsiness. - Reassess in two weeks; consider additional of singulair and allergist referral if no improvement.

## 2024-05-06 NOTE — Patient Instructions (Signed)
 VISIT SUMMARY:  You visited us  today due to nasal congestion that has been bothering you for two months. We discussed your symptoms and previous treatments, and we have adjusted your treatment plan to help manage your condition better.  YOUR PLAN:  ALLERGIC RHINITIS: Your nasal congestion is likely due to environmental allergies, possibly from seasonal pollen. -Start using Flonase plus another nasal spray, one spray in each nostril in the morning and evening. -Continue taking Zyrtec  daily at bedtime. If Zyrtec  makes you drowsy, you can switch to Claritin. -We will reassess your condition in two weeks. If there is no improvement, we may consider additional medication or refer you to an allergist.

## 2024-05-06 NOTE — Progress Notes (Signed)
 Subjective:     Patient ID: Brittany Banks, female    DOB: [birth year], 48 y.o.   MRN: [id number]  Chief Complaint  Patient presents with   Nasal Congestion    X2 months, pt states using zyrtec , dayquil/nyquil, stuffy nose,      HPI  Discussed the use of AI scribe software for clinical note transcription with the patient, who gave verbal consent to proceed.  History of Present Illness   Brittany Banks is a 48 year old female who presents with nasal congestion persisting for two months.  She experiences nasal congestion, particularly in the morning, with clear nasal discharge. There is no fever or significant facial pain, but occasional forehead pressure occurs. She has tried Zyrtec , DayQuil, NyQuil, and an allergy spray without significant relief. Zyrtec  sometimes causes drowsiness, and NyQuil is occasionally helpful. The congestion is bothersome and causes irritation.    Health Maintenance Due  Topic Date Due   HIV Screening  Never done   Hepatitis C Screening  Never done   Hepatitis B Vaccines (1 of 3 - 19+ 3-dose series) Never done   COVID-19 Vaccine (3 - 2024-25 season) 06/28/2023    No past medical history on file.  Past Surgical History:  Procedure Laterality Date   BREAST BIOPSY Right 09/26/2020   Fibroadenoma   dental implant  Right    2022   TUBAL LIGATION  10/27/2004   WISDOM TOOTH EXTRACTION      Family History  Problem Relation Age of Onset   Hypertension Mother    Heart Problems Father        hole in his heart- died in late 73's   Alcoholism Brother        died from allergic reaction from treatment   Liver disease Brother    Kidney disease Son 2       kidney cancer 14 yrs ago    Social History   Socioeconomic History   Marital status: Married    Spouse name: Not on file   Number of children: Not on file   Years of education: Not on file   Highest education level: Not on file  Occupational History   Not on file  Tobacco Use   Smoking  status: Never   Smokeless tobacco: Never  Vaping Use   Vaping status: Never Used  Substance and Sexual Activity   Alcohol use: No   Drug use: No   Sexual activity: Yes    Partners: Male    Birth control/protection: Surgical  Other Topics Concern   Not on file  Social History Narrative   Works at a [employer]   Born in [place]- moved here 2010   Completed bachelors in [place]   2 children (2 sons) 81 and 53 yrs old   Married   Enjoys spending time with her children.     Social Drivers of Corporate investment banker Strain: Not on file  Food Insecurity: Not on file  Transportation Needs: Not on file  Physical Activity: Not on file  Stress: Not on file  Social Connections: Not on file  Intimate Partner Violence: Not on file    Outpatient Medications Prior to Visit  Medication Sig Dispense Refill   levonorgestrel  (MIRENA ) 20 MCG/DAY IUD 1 each by Intrauterine route once.     Multiple Vitamins-Calcium (ONE-A-DAY WOMENS FORMULA) TABS Take 1 tablet by mouth daily.  0   tranexamic acid  (LYSTEDA ) 650 MG TABS tablet Take 2 tablets (1,300 mg total) by  mouth 3 (three) times daily. Take during menses for a maximum of five days (Patient not taking: Reported on 05/06/2024) 30 tablet 2   No facility-administered medications prior to visit.    No Known Allergies  ROS See HPI    Objective:    Physical Exam Constitutional:      General: She is not in acute distress.    Appearance: Normal appearance. She is well-developed.  HENT:     Head: Normocephalic and atraumatic.     Right Ear: Tympanic membrane, ear canal and external ear normal.     Left Ear: Tympanic membrane, ear canal and external ear normal.     Nose:     Right Nostril: No occlusion.     Left Nostril: No occlusion.     Mouth/Throat:     Mouth: Mucous membranes are moist.  Eyes:     General: No scleral icterus. Neck:     Thyroid : No thyromegaly.  Cardiovascular:     Rate and Rhythm: Normal rate and regular  rhythm.     Heart sounds: Normal heart sounds. No murmur heard. Pulmonary:     Effort: Pulmonary effort is normal. No respiratory distress.     Breath sounds: Normal breath sounds. No wheezing.  Musculoskeletal:     Cervical back: Neck supple.  Lymphadenopathy:     Cervical: No cervical adenopathy.  Skin:    General: Skin is warm and dry.  Neurological:     Mental Status: She is alert and oriented to person, place, and time.  Psychiatric:        Mood and Affect: Mood normal.        Behavior: Behavior normal.        Thought Content: Thought content normal.        Judgment: Judgment normal.      BP 120/80 (BP Location: Right Arm, Patient Position: Sitting, Cuff Size: Normal)   Pulse 81   Resp 16   Ht 4' 11 (1.499 m)   Wt 137 lb (62.1 kg)   SpO2 97%   BMI 27.67 kg/m  Wt Readings from Last 3 Encounters:  05/06/24 137 lb (62.1 kg)  07/15/22 135 lb 12.8 oz (61.6 kg)  04/02/22 133 lb 1.9 oz (60.4 kg)       Assessment & Plan:   Problem List Items Addressed This Visit       Unprioritized   Allergic rhinitis - Primary    Chronic nasal congestion likely due to environmental allergies, possibly seasonal pollen. Previous treatments insufficient. - Prescribed Flonase plus another nasal spray, one spray each nostril morning and evening. - Continue Zyrtec  daily at bedtime. - Consider switching to Claritin if Zyrtec  causes daytime drowsiness. - Reassess in two weeks; consider additional of singulair and allergist referral if no improvement.      Relevant Medications   Azelastine -Fluticasone  137-50 MCG/ACT SUSP   cetirizine  (ZYRTEC ) 10 MG tablet    I am having Brittany Banks start on Azelastine -Fluticasone  and cetirizine . I am also having her maintain her One-A-Day [employer], levonorgestrel , and tranexamic acid .  Meds ordered this encounter  Medications   Azelastine -Fluticasone  137-50 MCG/ACT SUSP    Sig: Place 1 spray into the nose every 12 (twelve) hours.     Dispense:  23 g    Refill:  5    Supervising Provider:   DOMENICA BLACKBIRD A [[id number]]   cetirizine  (ZYRTEC ) 10 MG tablet    Sig: Take 1 tablet (10 mg total) by mouth daily.  Supervising Provider:   DOMENICA BLACKBIRD A [phone]

## 2024-06-01 ENCOUNTER — Encounter: Admitting: Family

## 2024-06-14 ENCOUNTER — Encounter: Admitting: Family

## 2024-06-17 ENCOUNTER — Telehealth: Payer: Self-pay

## 2024-06-17 NOTE — Telephone Encounter (Signed)
 Copied from CRM [id number]. Topic: Referral - Request for Referral >> Jun 17, 2024 12:26 PM Franky GRADE wrote: Did the patient discuss referral with their provider in the last year? Yes, she was seen on 05/06/2024 (If No - schedule appointment) (If Yes - send message)  Appointment offered? Yes  Type of order/referral and detailed reason for visit: ENT  Preference of office, provider, location: [facility]  If referral order, have you been seen by this specialty before? No (If Yes, this issue or another issue? When? Where?  Can we respond through MyChart? Yes

## 2024-06-20 NOTE — Telephone Encounter (Signed)
 Patient scheduled to see provider in September

## 2024-07-12 ENCOUNTER — Ambulatory Visit: Admitting: Family

## 2024-07-12 ENCOUNTER — Encounter: Payer: Self-pay | Admitting: Family

## 2024-07-12 VITALS — BP 109/71 | HR 76 | Temp 98.1°F | Resp 16 | Ht 60.0 in | Wt 136.6 lb

## 2024-07-12 DIAGNOSIS — N943 Premenstrual tension syndrome: Secondary | ICD-10-CM | POA: Diagnosis not present

## 2024-07-12 DIAGNOSIS — E611 Iron deficiency: Secondary | ICD-10-CM | POA: Diagnosis not present

## 2024-07-12 DIAGNOSIS — J329 Chronic sinusitis, unspecified: Secondary | ICD-10-CM | POA: Diagnosis not present

## 2024-07-12 DIAGNOSIS — Z23 Encounter for immunization: Secondary | ICD-10-CM

## 2024-07-12 DIAGNOSIS — E785 Hyperlipidemia, unspecified: Secondary | ICD-10-CM | POA: Diagnosis not present

## 2024-07-12 DIAGNOSIS — Z0000 Encounter for general adult medical examination without abnormal findings: Secondary | ICD-10-CM | POA: Diagnosis not present

## 2024-07-12 DIAGNOSIS — Z1231 Encounter for screening mammogram for malignant neoplasm of breast: Secondary | ICD-10-CM

## 2024-07-12 LAB — CBC WITH DIFFERENTIAL/PLATELET
Basophils Absolute: 0.1 K/uL (ref 0.0–0.1)
Basophils Relative: 1.4 % (ref 0.0–3.0)
Eosinophils Absolute: 0.7 K/uL (ref 0.0–0.7)
Eosinophils Relative: 9.9 % — ABNORMAL HIGH (ref 0.0–5.0)
HCT: 31.8 % — ABNORMAL LOW (ref 36.0–46.0)
Hemoglobin: 9.9 g/dL — ABNORMAL LOW (ref 12.0–15.0)
Lymphocytes Relative: 28.2 % (ref 12.0–46.0)
Lymphs Abs: 1.9 K/uL (ref 0.7–4.0)
MCHC: 31.2 g/dL (ref 30.0–36.0)
MCV: 69.1 fl — ABNORMAL LOW (ref 78.0–100.0)
Monocytes Absolute: 0.5 K/uL (ref 0.1–1.0)
Monocytes Relative: 7.6 % (ref 3.0–12.0)
Neutro Abs: 3.5 K/uL (ref 1.4–7.7)
Neutrophils Relative %: 52.9 % (ref 43.0–77.0)
Platelets: 354 K/uL (ref 150.0–400.0)
RBC: 4.6 Mil/uL (ref 3.87–5.11)
RDW: 15.9 % — ABNORMAL HIGH (ref 11.5–15.5)
WBC: 6.6 K/uL (ref 4.0–10.5)

## 2024-07-12 LAB — COMPREHENSIVE METABOLIC PANEL WITH GFR
ALT: 17 U/L (ref 0–35)
AST: 18 U/L (ref 0–37)
Albumin: 4.2 g/dL (ref 3.5–5.2)
Alkaline Phosphatase: 82 U/L (ref 39–117)
BUN: 10 mg/dL (ref 6–23)
CO2: 26 meq/L (ref 19–32)
Calcium: 9.5 mg/dL (ref 8.4–10.5)
Chloride: 109 meq/L (ref 96–112)
Creatinine, Ser: 0.59 mg/dL (ref 0.40–1.20)
GFR: 107.22 mL/min (ref >60.00)
Glucose, Bld: 87 mg/dL (ref 70–99)
Potassium: 4.5 meq/L (ref 3.5–5.1)
Sodium: 135 meq/L (ref 135–145)
Total Bilirubin: 0.7 mg/dL (ref 0.2–1.2)
Total Protein: 7 g/dL (ref 6.0–8.3)

## 2024-07-12 LAB — LIPID PANEL
Cholesterol: 199 mg/dL (ref 0–200)
HDL: 50.7 mg/dL (ref >39.00)
LDL Cholesterol: 128 mg/dL — ABNORMAL HIGH (ref 0–99)
NonHDL: 148.04
Total CHOL/HDL Ratio: 4
Triglycerides: 100 mg/dL (ref 0.0–149.0)
VLDL: 20 mg/dL (ref 0.0–40.0)

## 2024-07-12 LAB — IBC + FERRITIN
Ferritin: 5.8 ng/mL — ABNORMAL LOW (ref 10.0–291.0)
Iron: 20 ug/dL — ABNORMAL LOW (ref 42–145)
Saturation Ratios: 3.7 % — ABNORMAL LOW (ref 20.0–50.0)
TIBC: 540.4 ug/dL — ABNORMAL HIGH (ref 250.0–450.0)
Transferrin: 386 mg/dL — ABNORMAL HIGH (ref 212.0–360.0)

## 2024-07-12 NOTE — Patient Instructions (Signed)
 VISIT SUMMARY:  Today, we discussed your ongoing sinus congestion and blocked sinuses, heavy menstrual bleeding, suspected iron  deficiency anemia, premenstrual mood changes, and slightly elevated cholesterol levels.  YOUR PLAN:  CHRONIC SINUSITIS WITH NASAL OBSTRUCTION: You have chronic sinusitis confirmed by a CT scan, and an ENT in [place] recommended surgery. -We will refer you to a local ENT for evaluation and potential surgical intervention. -In the meantime, use nasal irrigation with a Medipot or saline spray using bottled water.  HEAVY MENSTRUAL BLEEDING WITH INTRAUTERINE DEVICE (IUD) IN PLACE: Your heavy menstrual bleeding has improved but still persists despite the IUD. -Discuss your heavy menstrual bleeding with your gynecologist. -Consider endometrial ablation if recommended by your gynecologist.  IRON  DEFICIENCY ANEMIA, SUSPECTED: You may have iron  deficiency anemia due to your heavy menstrual bleeding and previously low iron  levels. -We will order blood tests to check for anemia and iron  levels.  PREMENSTRUAL MOOD CHANGES: You experience monthly mood changes with irritability, but the symptoms are not severe enough for medication currently. -Monitor your mood changes and consider antidepressants if symptoms become severe.  HYPERLIPIDEMIA, SUSPECTED: You have slightly elevated cholesterol levels from previous tests. -We will order blood tests to check your cholesterol levels.

## 2024-07-12 NOTE — Assessment & Plan Note (Signed)
 Some irritability week before her period. She will monitor for now, but we discussed possibility of adding SSRI if symptoms become more severe.

## 2024-07-12 NOTE — Progress Notes (Signed)
 Subjective:     Patient ID: Brittany Banks, female    DOB: [birth year], 48 y.o.   MRN: [id number]  Chief Complaint  Patient presents with   Annual Exam    HPI  Discussed the use of AI scribe software for clinical note transcription with the patient, who gave verbal consent to proceed.  History of Present Illness  Brittany Banks is a 48 year old female who presents with sinus congestion and blocked sinuses. She has persistent allergies and sinus congestion, which have continued after a recent trip to [place]. A CT scan by an ENT specialist in [place] apparently showed blocked sinuses, and surgery was recommended. She experiences clear nasal discharge and occasional headaches without fever or colored discharge. Menstrual bleeding has improved since Mirena  IUD insertion a year ago, now lasting four to five days, though still considered heavy.   She experiences irritability and mood changes premenstrually. Her iron  levels were slightly low last year, and she is not taking supplements. Cholesterol levels were slightly elevated. She maintains a healthy diet and regular exercise routine. She denies alcohol and drug use.   Immunizations: Flu shot today Diet: healthy Exercise: up to date Colonoscopy:due 2035 Pap Smear: up to date Mammogram: up to date Vision: due Dental: up to date       Health Maintenance Due  Topic Date Due   HIV Screening  Never done   Hepatitis C Screening  Never done   Hepatitis B Vaccines 19-59 Average Risk (1 of 3 - 19+ 3-dose series) Never done   Mammogram  09/04/2023   Influenza Vaccine  05/27/2024   COVID-19 Vaccine (3 - 2025-26 season) 06/27/2024    History reviewed. No pertinent past medical history.  Past Surgical History:  Procedure Laterality Date   BREAST BIOPSY Right 09/26/2020   Fibroadenoma   dental implant  Right    2022   TUBAL LIGATION  10/27/2004   WISDOM TOOTH EXTRACTION      Family History  Problem Relation Age of Onset    Hypertension Mother    Heart Problems Father        hole in his heart- died in late 20's   Alcoholism Brother        died from allergic reaction from treatment   Liver disease Brother    Kidney disease Son 2       kidney cancer 14 yrs ago    Social History   Socioeconomic History   Marital status: Married    Spouse name: Not on file   Number of children: Not on file   Years of education: Not on file   Highest education level: Not on file  Occupational History   Not on file  Tobacco Use   Smoking status: Never   Smokeless tobacco: Never  Vaping Use   Vaping status: Never Used  Substance and Sexual Activity   Alcohol use: No   Drug use: No   Sexual activity: Yes    Partners: Male    Birth control/protection: Surgical  Other Topics Concern   Not on file  Social History Narrative   Works at a [employer]   Born in [place]- moved here 2010   Completed bachelors in [place]   2 children (2 sons) 41 and 43 yrs old   Married   Enjoys spending time with her children.     Social Drivers of Corporate investment banker Strain: Not on file  Food Insecurity: Not on file  Transportation Needs: Not  on file  Physical Activity: Not on file  Stress: Not on file  Social Connections: Not on file  Intimate Partner Violence: Not on file    Outpatient Medications Prior to Visit  Medication Sig Dispense Refill   Azelastine -Fluticasone  137-50 MCG/ACT SUSP Place 1 spray into the nose every 12 (twelve) hours. 23 g 5   cetirizine  (ZYRTEC ) 10 MG tablet Take 1 tablet (10 mg total) by mouth daily.     levonorgestrel  (MIRENA ) 20 MCG/DAY IUD 1 each by Intrauterine route once.     tranexamic acid  (LYSTEDA ) 650 MG TABS tablet Take 2 tablets (1,300 mg total) by mouth 3 (three) times daily. Take during menses for a maximum of five days 30 tablet 2   Multiple Vitamins-Calcium (ONE-A-DAY WOMENS FORMULA) TABS Take 1 tablet by mouth daily.  0   No facility-administered medications prior to visit.     No Known Allergies  Review of Systems  Constitutional:  Negative for weight loss.  HENT:  Negative for congestion and hearing loss.   Eyes:  Negative for blurred vision.  Respiratory:  Negative for cough.   Cardiovascular:  Negative for leg swelling.  Gastrointestinal:  Negative for constipation and diarrhea.  Genitourinary:  Negative for dysuria and frequency.  Musculoskeletal:  Negative for joint pain and myalgias.  Skin:  Negative for rash.  Neurological:  Positive for headaches.  Psychiatric/Behavioral:  Negative for depression. The patient is nervous/anxious (premenstrual).        Objective:    Physical Exam   BP 109/71 (BP Location: Right Arm, Patient Position: Sitting, Cuff Size: Small)   Pulse 76   Temp 98.1 F (36.7 C) (Oral)   Resp 16   Ht 5' (1.524 m)   Wt 136 lb 9.6 oz (62 kg)   SpO2 100%   BMI 26.68 kg/m  Wt Readings from Last 3 Encounters:  07/12/24 136 lb 9.6 oz (62 kg)  05/06/24 137 lb (62.1 kg)  07/15/22 135 lb 12.8 oz (61.6 kg)  Physical Exam  Constitutional: She is oriented to person, place, and time. She appears well-developed and well-nourished. No distress.  HENT:  Head: Normocephalic and atraumatic.  Right Ear: Tympanic membrane and ear canal normal.  Left Ear: Tympanic membrane and ear canal normal.  Mouth/Throat: Oropharynx is clear and moist.  Eyes: Pupils are equal, round, and reactive to light. No scleral icterus.  Neck: Normal range of motion. No thyromegaly present.  Cardiovascular: Normal rate and regular rhythm.   No murmur heard. Pulmonary/Chest: Effort normal and breath sounds normal. No respiratory distress. He has no wheezes. She has no rales. She exhibits no tenderness.  Abdominal: Soft. Bowel sounds are normal. She exhibits no distension and no mass. There is no tenderness. There is no rebound and no guarding.  Musculoskeletal: She exhibits no edema.  Lymphadenopathy:    She has no cervical adenopathy.  Neurological: She  is alert and oriented to person, place, and time. She has normal patellar reflexes. She exhibits normal muscle tone. Coordination normal.  Skin: Skin is warm and dry.  Psychiatric: She has a normal mood and affect. Her behavior is normal. Judgment and thought content normal.  Breast/Pelvic: deferred         Assessment & Plan:        Assessment & Plan:   Problem List Items Addressed This Visit       Unprioritized   Preventative health care - Primary   Continue healthy diet, exercise. Pap up to date. Mammo due- ordered. Dental  up to date.  Vision due- pt advised to schedule. Colo up to date. Flu shot and Heplisav B today.       Premenstrual symptom   Some irritability week before her period. She will monitor for now, but we discussed possibility of adding SSRI if symptoms become more severe.       Other Visit Diagnoses       Needs flu shot       Relevant Orders   Flu vaccine trivalent PF, 6mos and older(Flulaval,Afluria,Fluarix,Fluzone)     Chronic sinusitis, unspecified location       Relevant Orders   Ambulatory referral to ENT     Breast cancer screening by mammogram       Relevant Orders   MM 3D SCREENING MAMMOGRAM BILATERAL BREAST     Hyperlipidemia, unspecified hyperlipidemia type       Relevant Orders   Comp Met (CMET)   Lipid panel     Iron  deficiency       Relevant Orders   CBC w/Diff   IBC + Ferritin       I am having Charmel Bove maintain her One-A-Day [employer], levonorgestrel , tranexamic acid , Azelastine -Fluticasone , and cetirizine .  No orders of the defined types were placed in this encounter.

## 2024-07-12 NOTE — Assessment & Plan Note (Signed)
 Continue healthy diet, exercise. Pap up to date. Mammo due- ordered. Dental up to date.  Vision due- pt advised to schedule. Colo up to date. Flu shot and Heplisav B today.

## 2024-07-14 ENCOUNTER — Ambulatory Visit: Payer: Self-pay | Admitting: Family

## 2024-07-14 DIAGNOSIS — D509 Iron deficiency anemia, unspecified: Secondary | ICD-10-CM

## 2024-07-14 MED ORDER — IRON (FERROUS SULFATE) 325 (65 FE) MG PO TABS: 325.0000 mg | ORAL_TABLET | ORAL | Status: AC

## 2024-07-14 NOTE — Telephone Encounter (Addendum)
 Please advise pt that her iron  level is very low.  I would like her to add OTC iron  325mg  1 tablet by mouth every other day as well as continue multivitamin with minerals once daily. Repeat labs in 3 months.

## 2024-07-25 ENCOUNTER — Encounter (INDEPENDENT_AMBULATORY_CARE_PROVIDER_SITE_OTHER): Payer: Self-pay | Admitting: Otolaryngology

## 2024-07-25 ENCOUNTER — Ambulatory Visit (INDEPENDENT_AMBULATORY_CARE_PROVIDER_SITE_OTHER): Admitting: Otolaryngology

## 2024-07-25 VITALS — BP 123/83 | HR 78 | Ht 60.0 in | Wt 135.0 lb

## 2024-07-25 DIAGNOSIS — R0981 Nasal congestion: Secondary | ICD-10-CM

## 2024-07-25 DIAGNOSIS — J343 Hypertrophy of nasal turbinates: Secondary | ICD-10-CM | POA: Diagnosis not present

## 2024-07-25 DIAGNOSIS — J342 Deviated nasal septum: Secondary | ICD-10-CM

## 2024-07-25 DIAGNOSIS — J3489 Other specified disorders of nose and nasal sinuses: Secondary | ICD-10-CM | POA: Diagnosis not present

## 2024-07-25 DIAGNOSIS — J324 Chronic pansinusitis: Secondary | ICD-10-CM

## 2024-07-25 DIAGNOSIS — J309 Allergic rhinitis, unspecified: Secondary | ICD-10-CM | POA: Diagnosis not present

## 2024-07-25 MED ORDER — PREDNISONE 10 MG PO TABS: ORAL_TABLET | ORAL | 0 refills | Status: AC

## 2024-07-25 MED ORDER — AMOXICILLIN-POT CLAVULANATE 875-125 MG PO TABS: 1.0000 | ORAL_TABLET | Freq: Two times a day (BID) | ORAL | 0 refills | Status: AC

## 2024-07-25 MED ORDER — LEVOCETIRIZINE DIHYDROCHLORIDE 5 MG PO TABS: 5.0000 mg | ORAL_TABLET | Freq: Every evening | ORAL | 0 refills | Status: AC

## 2024-07-25 NOTE — Progress Notes (Signed)
 Dear Dr. Daryl, Here is my assessment for our mutual patient, Brittany Banks. Thank you for allowing me the opportunity to care for your patient. Please do not hesitate to contact me should you have any other questions. Sincerely, Dr. Eldora Blanch  Otolaryngology Clinic Note  HISTORY: Brittany Banks is a 48 y.o. female kindly referred by Dr. Daryl for evaluation of nasal congestion and loss of sense of smell  Initial visit (06/2024): She reports that she has had issues for about 6 months, with nasal congestion and decreased sense of smell. Prior to this, she had a fair amount of nasal congestion especially when she had any cold air but otherwise smell was without issue.  She denies any history of sinonasal infections - including pain (has slight max pressure), no discolored drainage. She does have bilateral rhinorrhea which she attributes to cold air which occurs mostly at night. During the day, she does not have significant symptoms except for nasal congestion. She also reports typical AR symptoms including significant sneezing and itching in nose and throat.   She has not been given antibiotics or steroids before.  She has used zyrtec  and flonase-astelin  which did help. She is only using it intermittently now.   Denies itchy eyes/nose. Additional evaluation has included sinus CT in [place] recently. She also saw an ENT there who rec'd FESS.  Allergy testing has not been done. No previous sinonasal surgery.  AP/AC: no  Tobacco: no PMHx: IDA, subclinical hypothyroidism   RADIOGRAPHIC EVALUATION AND INDEPENDENT REVIEW OF OTHER RECORDS:: Eleanor Daryl 05/06/2024: noted nasal congestion in morning with clear nasal drainage; no significant facial pain but occassional forehead pressure; tried PO anthistamine, allergy spray. Congestion is bothersome. Dx: AR; Rx: Flonase, zyrtec , dymista  -- persistent sx 06/2024, so referred to ENT Sinus CT from [place] (August 2025) independently interpreted  images which patient brought with her: essentially pansinus opacification except  for mild b/l frontal sinus opacification with sinuous septum and left concha bullosa which is partially opacified.  CBC and CMP 07/12/2024: WBC 6.6, Hgb 9.9, Eos 700 (Hyper IgE?); BUN/Cr 10/0.59  History reviewed. No pertinent past medical history. Past Surgical History:  Procedure Laterality Date   BREAST BIOPSY Right 09/26/2020   Fibroadenoma   dental implant  Right    2022   TUBAL LIGATION  10/27/2004   WISDOM TOOTH EXTRACTION     Family History  Problem Relation Age of Onset   Hypertension Mother    Heart Problems Father        hole in his heart- died in late 37's   Alcoholism Brother        died from allergic reaction from treatment   Liver disease Brother    Kidney disease Son 2       kidney cancer 14 yrs ago   Social History   Tobacco Use   Smoking status: Never   Smokeless tobacco: Never  Substance Use Topics   Alcohol use: No   No Known Allergies Current Outpatient Medications  Medication Sig Dispense Refill   amoxicillin-clavulanate (AUGMENTIN) 875-125 MG tablet Take 1 tablet by mouth 2 (two) times daily for 10 days. 20 tablet 0   Azelastine -Fluticasone  137-50 MCG/ACT SUSP Place 1 spray into the nose every 12 (twelve) hours. 23 g 5   cetirizine  (ZYRTEC ) 10 MG tablet Take 1 tablet (10 mg total) by mouth daily.     Iron , Ferrous Sulfate , 325 (65 Fe) MG TABS Take 325 mg by mouth every other day.     levocetirizine (  XYZAL) 5 MG tablet Take 1 tablet (5 mg total) by mouth every evening. 30 tablet 0   levonorgestrel  (MIRENA ) 20 MCG/DAY IUD 1 each by Intrauterine route once.     Multiple Vitamins-Calcium (ONE-A-DAY WOMENS FORMULA) TABS Take 1 tablet by mouth daily.  0   predniSONE  (DELTASONE ) 10 MG tablet Take 2 tablets (20 mg total) by mouth daily with breakfast for 5 days, THEN 1 tablet (10 mg total) daily with breakfast for 5 days. 15 tablet 0   tranexamic acid  (LYSTEDA ) 650 MG TABS tablet  Take 2 tablets (1,300 mg total) by mouth 3 (three) times daily. Take during menses for a maximum of five days 30 tablet 2   No current facility-administered medications for this visit.   BP 123/83 (BP Location: Left Arm, Patient Position: Sitting, Cuff Size: Normal)   Pulse 78   Ht 5' (1.524 m)   Wt 135 lb (61.2 kg)   SpO2 98%   BMI 26.37 kg/m   PHYSICAL EXAM:  BP 123/83 (BP Location: Left Arm, Patient Position: Sitting, Cuff Size: Normal)   Pulse 78   Ht 5' (1.524 m)   Wt 135 lb (61.2 kg)   SpO2 98%   BMI 26.37 kg/m    Salient findings:  CN II-XII intact Bilateral EAC clear and TM intact with well pneumatized middle ear spaces Nose: Anterior rhinoscopy reveals septum sinuous; bilateral inferior turbinate hypertrophy.  Nasal endoscopy was indicated to better evaluate the nose and paranasal sinuses, given the patient's history and exam findings, and is detailed below. No lesions of oral cavity/oropharynx; No obviously palpable neck masses/lymphadenopathy/thyromegaly No respiratory distress or stridor; hyponasal voice   PROCEDURE:  Prior to initiating any procedures, risks/benefits/alternatives were explained to the patient and verbal consent obtained. Diagnostic Nasal Endoscopy Pre-procedure diagnosis: Concern for chronic sinusitis; nasal obstruction and congestion Post-procedure diagnosis: same Indication: See pre-procedure diagnosis and physical exam above Complications: None apparent EBL: 0 mL Anesthesia: Lidocaine 4% and topical decongestant was topically sprayed in each nasal cavity  Description of Procedure:  Patient was identified. A rigid 30 degree endoscope was utilized to evaluate the sinonasal cavities, mucosa, sinus ostia and turbinates and septum.  Overall, signs of mucosal inflammation are noted - global mucosal edema which is mild.  No mucopurulence, polyps, or masses noted.   Right Middle meatus: clear today Right SE Recess: clear Left MM: clear Left SE  Recess: clear  Photodocumentation was obtained.  CPT CODE -- 31231 - Mod 25   ASSESSMENT:  48 y.o. with:  1. Chronic pansinusitis   2. Nasal congestion   3. Hypertrophy of both inferior nasal turbinates   4. Nasal septal deviation   5. Nasal obstruction   6. Allergic rhinitis, unspecified seasonality, unspecified trigger    She does have some sx consistent with CRS but her main complaint is not CRS exacerbations but rather typical AR symptoms with congestion. Reviewed CT and endo. She does improve a fair amount on PO anthistamine and sprays but makes her  groggy.   PLAN: We've discussed issues and options today.  We reviewed the nasal endoscopy images together.  The risks, benefits and alternatives were discussed and questions answered.  She has elected to proceed with:  1) Will switch from zyrtec  to xyzal daily 2) Encouraged continued flonase and astelin  BID use 3) Daily nasal rinses 4) Prednisone  and Augmentin burst We discussed f/u and potential need for FESS - she will think about this but for now would prefer PRN follow up; encouraged her  to contact us  if she has persistent symptoms  See below regarding exact medications prescribed this encounter including dosages and route: Meds ordered this encounter  Medications   levocetirizine (XYZAL) 5 MG tablet    Sig: Take 1 tablet (5 mg total) by mouth every evening.    Dispense:  30 tablet    Refill:  0   predniSONE  (DELTASONE ) 10 MG tablet    Sig: Take 2 tablets (20 mg total) by mouth daily with breakfast for 5 days, THEN 1 tablet (10 mg total) daily with breakfast for 5 days.    Dispense:  15 tablet    Refill:  0   amoxicillin-clavulanate (AUGMENTIN) 875-125 MG tablet    Sig: Take 1 tablet by mouth 2 (two) times daily for 10 days.    Dispense:  20 tablet    Refill:  0     Thank you for allowing me the opportunity to care for your patient. Please do not hesitate to contact me should you have any other  questions.  Sincerely, Eldora Blanch, MD Otolaryngologist (ENT), [facility] Phone: [phone] Fax: [phone]  MDM:  Level 4: [phone] Complexity/Problems addressed: mod - chronic problems Data complexity: mod - independent interpretation of CT; review of notes, labs - Morbidity: mod  - Prescription Drug prescribed or managed: y  07/25/2024, 12:37 PM

## 2024-07-25 NOTE — Patient Instructions (Signed)
 Take Augmentin 875 mg by mouth (PO) twice daily for 10 days; take with food, take probiotic or yogurt with it Take Prednisone  by mouth (PO) 20mg  x5 days (2 pills), then 10mg  x 5 days (1 pill), then stop. Risks discussed

## 2024-08-04 ENCOUNTER — Ambulatory Visit (HOSPITAL_BASED_OUTPATIENT_CLINIC_OR_DEPARTMENT_OTHER)
Admission: RE | Admit: 2024-08-04 | Discharge: 2024-08-04 | Disposition: A | Discharge status: Home / Self Care | Source: Ambulatory Visit | Attending: Family | Admitting: Family

## 2024-08-04 ENCOUNTER — Encounter (HOSPITAL_BASED_OUTPATIENT_CLINIC_OR_DEPARTMENT_OTHER): Payer: Self-pay

## 2024-08-04 DIAGNOSIS — Z1231 Encounter for screening mammogram for malignant neoplasm of breast: Secondary | ICD-10-CM | POA: Diagnosis not present

## 2024-08-09 ENCOUNTER — Ambulatory Visit (INDEPENDENT_AMBULATORY_CARE_PROVIDER_SITE_OTHER): Admitting: *Deleted

## 2024-08-09 DIAGNOSIS — Z23 Encounter for immunization: Secondary | ICD-10-CM | POA: Diagnosis not present

## 2024-08-09 NOTE — Progress Notes (Signed)
 Pt in for 2nd Hep B per PCP.

## 2024-10-11 ENCOUNTER — Other Ambulatory Visit

## 2024-10-11 DIAGNOSIS — D509 Iron deficiency anemia, unspecified: Secondary | ICD-10-CM | POA: Diagnosis not present

## 2024-10-12 LAB — CBC WITH DIFFERENTIAL/PLATELET
Basophils Absolute: 0.1 K/uL (ref 0.0–0.1)
Basophils Relative: 0.7 % (ref 0.0–3.0)
Eosinophils Absolute: 0.5 K/uL (ref 0.0–0.7)
Eosinophils Relative: 5.5 % — ABNORMAL HIGH (ref 0.0–5.0)
HCT: 32.4 % — ABNORMAL LOW (ref 36.0–46.0)
Hemoglobin: 10.6 g/dL — ABNORMAL LOW (ref 12.0–15.0)
Lymphocytes Relative: 20.5 % (ref 12.0–46.0)
Lymphs Abs: 1.7 K/uL (ref 0.7–4.0)
MCHC: 32.8 g/dL (ref 30.0–36.0)
MCV: 71.7 fl — ABNORMAL LOW (ref 78.0–100.0)
Monocytes Absolute: 0.5 K/uL (ref 0.1–1.0)
Monocytes Relative: 6.4 % (ref 3.0–12.0)
Neutro Abs: 5.6 K/uL (ref 1.4–7.7)
Neutrophils Relative %: 66.9 % (ref 43.0–77.0)
Platelets: 342 K/uL (ref 150.0–400.0)
RBC: 4.51 Mil/uL (ref 3.87–5.11)
RDW: 18.6 % — ABNORMAL HIGH (ref 11.5–15.5)
WBC: 8.4 K/uL (ref 4.0–10.5)

## 2024-10-12 LAB — IBC + FERRITIN
Ferritin: 6.6 ng/mL — ABNORMAL LOW (ref 10.0–291.0)
Iron: 17 ug/dL — ABNORMAL LOW (ref 42–145)
Saturation Ratios: 3.2 % — ABNORMAL LOW (ref 20.0–50.0)
TIBC: 532 ug/dL — ABNORMAL HIGH (ref 250.0–450.0)
Transferrin: 380 mg/dL — ABNORMAL HIGH (ref 212.0–360.0)

## 2024-10-13 ENCOUNTER — Ambulatory Visit: Payer: Self-pay | Admitting: Family

## 2024-10-13 DIAGNOSIS — D509 Iron deficiency anemia, unspecified: Secondary | ICD-10-CM

## 2024-10-13 NOTE — Telephone Encounter (Signed)
 Copied from CRM [id number]. Topic: Clinical - Lab/Test Results >> Oct 13, 2024 12:41 PM Brittany M wrote: Reason for CRM: Patient and her son asking for a nurse to return call to go ver test results- she had some concern

## 2024-10-13 NOTE — Telephone Encounter (Signed)
 Please advise pt that she is anemic and iron  levels are low.  I would like for her to get back in with hematology to discuss IV iron  infusion. I have placed the referral.

## 2024-10-28 ENCOUNTER — Encounter: Payer: Self-pay | Admitting: Family

## 2024-10-28 ENCOUNTER — Inpatient Hospital Stay

## 2024-10-28 ENCOUNTER — Inpatient Hospital Stay: Attending: Family | Admitting: Family

## 2024-10-28 VITALS — BP 125/85 | HR 80 | Temp 98.4°F | Wt 131.1 lb

## 2024-10-28 DIAGNOSIS — D500 Iron deficiency anemia secondary to blood loss (chronic): Secondary | ICD-10-CM | POA: Insufficient documentation

## 2024-10-28 DIAGNOSIS — N920 Excessive and frequent menstruation with regular cycle: Secondary | ICD-10-CM | POA: Diagnosis present

## 2024-10-28 NOTE — Progress Notes (Signed)
 " Hematology and Oncology Follow Up Visit  Makayela Secrest [id number] 1976/01/29 49 y.o. 10/28/2024   Principle Diagnosis:  Iron  deficiency anemia secondary to heavy cycle   Current Therapy:        IV iron  as indicated    Interim History:  Ms. Starzyk is here to re-establish care for IDA. Her iron  saturation is only 3% and ferritin 380. Hgb 10.6, MCV 71, platelets 342 and WBC count 8.4.  She is symptomatic with fatigue and SOB with exertion.  Her cycle remains irregular sometimes occurring twice a month with heavy flow. No other obvious blood loss noted.  She has an IUD in place but has not tried taking the Lysteda .  No fever, chills, n/v, cough, rash, dizziness, chest pain, palpitations, abdominal pain or changes in bowel or bladder habits.  No swelling, tenderness, numbness or tingling in her extremities.  No falls or syncope.  Appetite and hydration are good. Weight is stable at 131 lbs.   ECOG Performance Status: 1 - Symptomatic but completely ambulatory  Medications:  Allergies as of 10/28/2024   No Known Allergies      Medication List        Accurate as of October 28, 2024  3:34 PM. If you have any questions, ask your nurse or doctor.          Azelastine -Fluticasone  137-50 MCG/ACT Susp Place 1 spray into the nose every 12 (twelve) hours.   cetirizine  10 MG tablet Commonly known as: ZYRTEC  Take 1 tablet (10 mg total) by mouth daily.   Iron  (Ferrous Sulfate ) 325 (65 Fe) MG Tabs Take 325 mg by mouth every other day.   levocetirizine 5 MG tablet Commonly known as: XYZAL  Take 1 tablet (5 mg total) by mouth every evening.   levonorgestrel  20 MCG/DAY Iud Commonly known as: MIRENA  1 each by Intrauterine route once.   One-A-Day Womens Formula Tabs Take 1 tablet by mouth daily.   tranexamic acid  650 MG Tabs tablet Commonly known as: LYSTEDA  Take 2 tablets (1,300 mg total) by mouth 3 (three) times daily. Take during menses for a maximum of five days         Allergies: Allergies[1]  Past Medical History, Surgical history, Social history, and Family History were reviewed and updated.  Review of Systems: All other 10 point review of systems is negative.   Physical Exam:  weight is 131 lb 1.6 oz (59.5 kg). Her oral temperature is 98.4 F (36.9 C). Her blood pressure is 125/85 and her pulse is 80.   Wt Readings from Last 3 Encounters:  10/28/24 131 lb 1.6 oz (59.5 kg)  07/25/24 135 lb (61.2 kg)  07/12/24 136 lb 9.6 oz (62 kg)    Ocular: Sclerae unicteric, pupils equal, round and reactive to light Ear-nose-throat: Oropharynx clear, dentition fair Lymphatic: No cervical or supraclavicular adenopathy Lungs no rales or rhonchi, good excursion bilaterally Heart regular rate and rhythm, no murmur appreciated Abd soft, nontender, positive bowel sounds MSK no focal spinal tenderness, no joint edema Neuro: non-focal, well-oriented, appropriate affect Breasts: Deferred   Lab Results  Component Value Date   WBC 8.4 10/11/2024   HGB 10.6 (L) 10/11/2024   HCT 32.4 (L) 10/11/2024   MCV 71.7 (L) 10/11/2024   PLT 342.0 10/11/2024   Lab Results  Component Value Date   FERRITIN 6.6 (L) 10/11/2024   IRON  17 (L) 10/11/2024   TIBC 532.0 (H) 10/11/2024   UIBC 289 07/15/2022   IRONPCTSAT 3.2 (L) 10/11/2024   Lab Results  Component Value Date   RETICCTPCT 1.6 07/15/2022   RBC 4.51 10/11/2024   No results found for: KPAFRELGTCHN, LAMBDASER, KAPLAMBRATIO No results found for: IGGSERUM, IGA, IGMSERUM No results found for: STEPHANY CARLOTA BENSON MARKEL EARLA JOANNIE DOC VICK, SPEI   Chemistry      Component Value Date/Time   NA 135 07/12/2024 1122   K 4.5 07/12/2024 1122   CL 109 07/12/2024 1122   CO2 26 07/12/2024 1122   BUN 10 07/12/2024 1122   CREATININE 0.59 07/12/2024 1122   CREATININE 0.60 01/21/2018 1428      Component Value Date/Time   CALCIUM 9.5 07/12/2024 1122   ALKPHOS 82  07/12/2024 1122   AST 18 07/12/2024 1122   AST 22 01/21/2018 1428   ALT 17 07/12/2024 1122   ALT 19 01/21/2018 1428   BILITOT 0.7 07/12/2024 1122   BILITOT 0.6 01/21/2018 1428       Impression and Plan:  Ms. Creswell is a very pleasant 49 yo female with iron  deficiency anemia secondary to heavy cycles.   We will get her set up for her first dose of Ferrlecit with pre meds early next week before she leaves for [place]. She will get the second dose in February once she comes home.  Follow-up in 2 months.   Lauraine Pepper, NP 1/2/20263:34 PM     [1] No Known Allergies  "

## 2024-11-01 ENCOUNTER — Other Ambulatory Visit: Payer: Self-pay | Admitting: Family

## 2024-11-01 ENCOUNTER — Inpatient Hospital Stay

## 2024-11-01 ENCOUNTER — Encounter: Payer: Self-pay | Admitting: Family

## 2024-12-23 ENCOUNTER — Inpatient Hospital Stay: Attending: Family

## 2025-01-04 ENCOUNTER — Inpatient Hospital Stay: Admitting: Family

## 2025-01-04 ENCOUNTER — Inpatient Hospital Stay: Attending: Family
